# Patient Record
Sex: Male | Born: 1960
Health system: Southern US, Community
[De-identification: ages and names within clinical notes are randomized; demographics above are authoritative.]

## PROBLEM LIST (undated history)

## (undated) DIAGNOSIS — E039 Hypothyroidism, unspecified: Secondary | ICD-10-CM

## (undated) DIAGNOSIS — F32A Depression, unspecified: Secondary | ICD-10-CM

## (undated) DIAGNOSIS — Z9109 Other allergy status, other than to drugs and biological substances: Secondary | ICD-10-CM

## (undated) DIAGNOSIS — M1990 Unspecified osteoarthritis, unspecified site: Secondary | ICD-10-CM

## (undated) DIAGNOSIS — K219 Gastro-esophageal reflux disease without esophagitis: Secondary | ICD-10-CM

## (undated) DIAGNOSIS — F900 Attention-deficit hyperactivity disorder, predominantly inattentive type: Secondary | ICD-10-CM

## (undated) DIAGNOSIS — F329 Major depressive disorder, single episode, unspecified: Secondary | ICD-10-CM

## (undated) DIAGNOSIS — M51369 Other intervertebral disc degeneration, lumbar region without mention of lumbar back pain or lower extremity pain: Secondary | ICD-10-CM

## (undated) DIAGNOSIS — I10 Essential (primary) hypertension: Secondary | ICD-10-CM

## (undated) DIAGNOSIS — M5136 Other intervertebral disc degeneration, lumbar region: Secondary | ICD-10-CM

## (undated) DIAGNOSIS — Z9289 Personal history of other medical treatment: Secondary | ICD-10-CM

## (undated) DIAGNOSIS — F419 Anxiety disorder, unspecified: Secondary | ICD-10-CM

## (undated) HISTORY — DX: Attention-deficit hyperactivity disorder, predominantly inattentive type: F90.0

## (undated) HISTORY — DX: Anxiety disorder, unspecified: F41.9

---

## 1983-01-19 HISTORY — PX: BACK SURGERY: SHX140

## 1996-01-19 HISTORY — PX: THYROIDECTOMY, PARTIAL: SHX18

## 2005-07-23 ENCOUNTER — Encounter: Admission: RE | Admit: 2005-07-23 | Discharge: 2005-07-23 | Payer: Self-pay | Admitting: Family Medicine

## 2006-03-04 ENCOUNTER — Encounter: Admission: RE | Admit: 2006-03-04 | Discharge: 2006-03-04 | Payer: Self-pay | Admitting: Family Medicine

## 2009-02-17 ENCOUNTER — Inpatient Hospital Stay (HOSPITAL_COMMUNITY): Admission: RE | Admit: 2009-02-17 | Discharge: 2009-02-20 | Payer: Self-pay | Admitting: Psychiatry

## 2009-02-17 ENCOUNTER — Ambulatory Visit: Payer: Self-pay | Admitting: Psychiatry

## 2009-02-25 ENCOUNTER — Ambulatory Visit: Payer: Self-pay | Admitting: Psychiatry

## 2009-02-25 ENCOUNTER — Other Ambulatory Visit (HOSPITAL_COMMUNITY): Admission: RE | Admit: 2009-02-25 | Discharge: 2009-03-10 | Payer: Self-pay | Admitting: Psychiatry

## 2010-02-07 ENCOUNTER — Encounter: Payer: Self-pay | Admitting: Family Medicine

## 2010-04-06 LAB — DRUGS OF ABUSE SCREEN W/O ALC, ROUTINE URINE
Amphetamine Screen, Ur: NEGATIVE
Barbiturate Quant, Ur: NEGATIVE
Benzodiazepines.: NEGATIVE
Cocaine Metabolites: NEGATIVE
Creatinine,U: 186.2 mg/dL
Marijuana Metabolite: NEGATIVE
Methadone: NEGATIVE
Opiate Screen, Urine: NEGATIVE
Phencyclidine (PCP): NEGATIVE
Propoxyphene: NEGATIVE

## 2010-04-06 LAB — TSH: TSH: 3.244 u[IU]/mL (ref 0.350–4.500)

## 2010-04-06 LAB — CBC
HCT: 44.3 % (ref 39.0–52.0)
Hemoglobin: 15.1 g/dL (ref 13.0–17.0)
MCHC: 34.1 g/dL (ref 30.0–36.0)
MCV: 93.2 fL (ref 78.0–100.0)
Platelets: 158 10*3/uL (ref 150–400)
RBC: 4.75 MIL/uL (ref 4.22–5.81)
RDW: 12.9 % (ref 11.5–15.5)
WBC: 6.2 10*3/uL (ref 4.0–10.5)

## 2010-04-06 LAB — COMPREHENSIVE METABOLIC PANEL
ALT: 21 U/L (ref 0–53)
AST: 27 U/L (ref 0–37)
Albumin: 3.8 g/dL (ref 3.5–5.2)
Alkaline Phosphatase: 56 U/L (ref 39–117)
BUN: 19 mg/dL (ref 6–23)
CO2: 29 mEq/L (ref 19–32)
Calcium: 8.8 mg/dL (ref 8.4–10.5)
Chloride: 101 mEq/L (ref 96–112)
Creatinine, Ser: 1.39 mg/dL (ref 0.4–1.5)
GFR calc Af Amer: >60 mL/min (ref >60)
GFR calc non Af Amer: 55 mL/min — ABNORMAL LOW (ref >60)
Glucose, Bld: 100 mg/dL — ABNORMAL HIGH (ref 70–99)
Potassium: 3.5 mEq/L (ref 3.5–5.1)
Sodium: 137 mEq/L (ref 135–145)
Total Bilirubin: 0.9 mg/dL (ref 0.3–1.2)
Total Protein: 6.9 g/dL (ref 6.0–8.3)

## 2010-04-06 LAB — DIFFERENTIAL
Basophils Absolute: 0.1 10*3/uL (ref 0.0–0.1)
Basophils Relative: 1 % (ref 0–1)
Eosinophils Absolute: 0.1 10*3/uL (ref 0.0–0.7)
Eosinophils Relative: 2 % (ref 0–5)
Lymphocytes Relative: 25 % (ref 12–46)
Lymphs Abs: 1.6 10*3/uL (ref 0.7–4.0)
Monocytes Absolute: 1 10*3/uL (ref 0.1–1.0)
Monocytes Relative: 16 % — ABNORMAL HIGH (ref 3–12)
Neutro Abs: 3.5 10*3/uL (ref 1.7–7.7)
Neutrophils Relative %: 56 % (ref 43–77)

## 2012-04-19 ENCOUNTER — Encounter (HOSPITAL_COMMUNITY): Payer: Self-pay | Admitting: Pharmacy Technician

## 2012-04-25 NOTE — Patient Instructions (Addendum)
20 [name] NOBLE CICALESE  04/25/2012   Your procedure is scheduled on:  05/02/12 TUESDAY  Report to [facility] at     1:35 PM Call this number if you have problems the morning of surgery: [phone]       Remember:   Do not eat food After Midnight.MONDAY NIGHT--- MAY HAVE CLEAR LIQUIDS UNTIL 10:30 AM Tuesday MORNING--NO MILK PRODUCTS-  NOTHING BY MOUTH AFTER 1030 AM   Take these medicines the morning of surgery with A SIP OF WATER: Levothyroxine, PROPRANOL, Bupropian, Claritin, Prolisec, VIBRID,  May take Lortab if needed  .  Contacts, dentures or partial plates can not be worn to surgery  Leave suitcase in the car. After surgery it may be brought to your room.  For patients admitted to the hospital, checkout time is 11:00 AM day of  discharge.             SPECIAL INSTRUCTIONS- SEE  PREPARING FOR SURGERY INSTRUCTION SHEET-     DO NOT WEAR JEWELRY, LOTIONS, POWDERS, OR PERFUMES.  WOMEN-- DO NOT SHAVE LEGS OR UNDERARMS FOR 12 HOURS BEFORE SHOWERS. MEN MAY SHAVE FACE.  Patients discharged the day of surgery will not be allowed to drive home. IF going home the day of surgery, you must have a driver and someone to stay with you for the first 24 hours  Name and phone number of your driver:  admission                                                                      Please read over the following fact sheets that you were given: MRSA Information, Incentive Spirometry Sheet, Blood Transfusion Sheet  Information                                                                                   Patrick [name]  PST [phone]  [phone]                 FAILURE TO FOLLOW THESE INSTRUCTIONS MAY RESULT IN  CANCELLATION   OF YOUR SURGERY                                                  Patient Signature _____________________________

## 2012-04-25 NOTE — Progress Notes (Signed)
Clearance note with OV 03/16/12 Dr Jillyn Hidden on chart

## 2012-04-26 ENCOUNTER — Encounter (HOSPITAL_COMMUNITY)
Admission: RE | Admit: 2012-04-26 | Discharge: 2012-04-26 | Disposition: A | Discharge status: Home / Self Care | Payer: Managed Care, Other (non HMO) | Source: Ambulatory Visit | Attending: Orthopedic Surgery | Admitting: Orthopedic Surgery

## 2012-04-26 ENCOUNTER — Other Ambulatory Visit (HOSPITAL_COMMUNITY): Payer: Self-pay

## 2012-04-26 ENCOUNTER — Ambulatory Visit (HOSPITAL_COMMUNITY)
Admission: RE | Admit: 2012-04-26 | Discharge: 2012-04-26 | Disposition: A | Discharge status: Home / Self Care | Payer: Managed Care, Other (non HMO) | Source: Ambulatory Visit | Attending: Orthopedic Surgery | Admitting: Orthopedic Surgery

## 2012-04-26 ENCOUNTER — Encounter (HOSPITAL_COMMUNITY): Payer: Self-pay

## 2012-04-26 DIAGNOSIS — Z01812 Encounter for preprocedural laboratory examination: Secondary | ICD-10-CM | POA: Insufficient documentation

## 2012-04-26 DIAGNOSIS — Z79899 Other long term (current) drug therapy: Secondary | ICD-10-CM | POA: Insufficient documentation

## 2012-04-26 DIAGNOSIS — Z87891 Personal history of nicotine dependence: Secondary | ICD-10-CM | POA: Insufficient documentation

## 2012-04-26 DIAGNOSIS — Z01818 Encounter for other preprocedural examination: Secondary | ICD-10-CM | POA: Insufficient documentation

## 2012-04-26 DIAGNOSIS — M169 Osteoarthritis of hip, unspecified: Secondary | ICD-10-CM | POA: Insufficient documentation

## 2012-04-26 DIAGNOSIS — M1610 Unilateral primary osteoarthritis, unspecified hip: Secondary | ICD-10-CM | POA: Insufficient documentation

## 2012-04-26 DIAGNOSIS — K219 Gastro-esophageal reflux disease without esophagitis: Secondary | ICD-10-CM | POA: Insufficient documentation

## 2012-04-26 DIAGNOSIS — Z01810 Encounter for preprocedural cardiovascular examination: Secondary | ICD-10-CM | POA: Insufficient documentation

## 2012-04-26 DIAGNOSIS — E039 Hypothyroidism, unspecified: Secondary | ICD-10-CM | POA: Insufficient documentation

## 2012-04-26 DIAGNOSIS — I10 Essential (primary) hypertension: Secondary | ICD-10-CM | POA: Insufficient documentation

## 2012-04-26 HISTORY — DX: Depression, unspecified: F32.A

## 2012-04-26 HISTORY — DX: Unspecified osteoarthritis, unspecified site: M19.90

## 2012-04-26 HISTORY — DX: Other intervertebral disc degeneration, lumbar region without mention of lumbar back pain or lower extremity pain: M51.369

## 2012-04-26 HISTORY — DX: Gastro-esophageal reflux disease without esophagitis: K21.9

## 2012-04-26 HISTORY — DX: Other allergy status, other than to drugs and biological substances: Z91.09

## 2012-04-26 HISTORY — DX: Personal history of other medical treatment: Z92.89

## 2012-04-26 HISTORY — DX: Essential (primary) hypertension: I10

## 2012-04-26 HISTORY — DX: Hypothyroidism, unspecified: E03.9

## 2012-04-26 HISTORY — DX: Major depressive disorder, single episode, unspecified: F32.9

## 2012-04-26 HISTORY — DX: Other intervertebral disc degeneration, lumbar region: M51.36

## 2012-04-26 LAB — URINALYSIS, ROUTINE W REFLEX MICROSCOPIC
Bilirubin Urine: NEGATIVE
Glucose, UA: NEGATIVE mg/dL
Hgb urine dipstick: NEGATIVE
Ketones, ur: NEGATIVE mg/dL
Leukocytes, UA: NEGATIVE
Nitrite: NEGATIVE
Protein, ur: NEGATIVE mg/dL
Specific Gravity, Urine: 1.02 (ref 1.005–1.030)
Urobilinogen, UA: 0.2 mg/dL (ref 0.0–1.0)
pH: 6 (ref 5.0–8.0)

## 2012-04-26 LAB — BASIC METABOLIC PANEL
BUN: 19 mg/dL (ref 6–23)
CO2: 32 mEq/L (ref 19–32)
Calcium: 9.4 mg/dL (ref 8.4–10.5)
Chloride: 102 mEq/L (ref 96–112)
Creatinine, Ser: 1.33 mg/dL (ref 0.50–1.35)
GFR calc Af Amer: 71 mL/min — ABNORMAL LOW (ref >90)
GFR calc non Af Amer: 61 mL/min — ABNORMAL LOW (ref >90)
Glucose, Bld: 108 mg/dL — ABNORMAL HIGH (ref 70–99)
Potassium: 4.5 mEq/L (ref 3.5–5.1)
Sodium: 141 mEq/L (ref 135–145)

## 2012-04-26 LAB — CBC
HCT: 48.2 % (ref 39.0–52.0)
Hemoglobin: 16.5 g/dL (ref 13.0–17.0)
MCH: 31.1 pg (ref 26.0–34.0)
MCHC: 34.2 g/dL (ref 30.0–36.0)
MCV: 90.8 fL (ref 78.0–100.0)
Platelets: 186 10*3/uL (ref 150–400)
RBC: 5.31 MIL/uL (ref 4.22–5.81)
RDW: 12.8 % (ref 11.5–15.5)
WBC: 5.4 10*3/uL (ref 4.0–10.5)

## 2012-04-26 LAB — ABO/RH: ABO/RH(D): A NEG

## 2012-04-26 LAB — SURGICAL PCR SCREEN
MRSA, PCR: NEGATIVE
Staphylococcus aureus: POSITIVE — AB

## 2012-04-26 LAB — PROTIME-INR
INR: 0.95 (ref 0.00–1.49)
Prothrombin Time: 12.6 seconds (ref 11.6–15.2)

## 2012-04-26 LAB — APTT: aPTT: 29 seconds (ref 24–37)

## 2012-04-30 NOTE — H&P (Signed)
TOTAL HIP ADMISSION H&P  Patient is admitted for left total hip arthroplasty, anterior approach.  Subjective:  Chief Complaint: left hip OA / pain  HPI: Patrick [name], 52 y.o. male, has a history of pain and functional disability in the left hip(s) due to arthritis and patient has failed non-surgical conservative treatments for greater than 12 weeks to include NSAID's and/or analgesics and activity modification.  Onset of symptoms was gradual starting 2 years ago with gradually worsening course since that time.The patient noted no past surgery on the left hip(s).  Patient currently rates pain in the left hip at 8 out of 10 with activity. Patient has worsening of pain with activity and weight bearing, trendelenberg gait, pain that interfers with activities of daily living and pain with passive range of motion. Patient has evidence of periarticular osteophytes and joint space narrowing by imaging studies. This condition presents safety issues increasing the risk of falls. There is no current active infection.  Risks, benefits and expectations were discussed with the patient. Patient understand the risks, benefits and expectations and wishes to proceed with surgery.   D/C Plans:   Home with HHPT  Post-op Meds:   Rx given for Xarelto, Zanaflex, Iron, MiraLax and Colace  Tranexamic Acid:   To be given  Decadron:    To be given  FYI:   Nothing to note   Past Medical History  Diagnosis Date  . Hypertension   . Hypothyroidism   . Depression   . GERD (gastroesophageal reflux disease)   . Arthritis   . History of blood transfusion   . DDD (degenerative disc disease), lumbar   . Environmental allergies     Past Surgical History  Procedure Laterality Date  . Thyroidectomy, partial  1998  . Back surgery Right 1985    microdiscectomy    No prescriptions prior to admission   Allergies  Allergen Reactions  . Aspirin Hives    History  Substance Use Topics  . Smoking status: Former  Smoker    Types: Cigarettes    Quit date: 04/27/2002  . Smokeless tobacco: Former Neurosurgeon    Quit date: 04/27/2002  . Alcohol Use: Yes     Comment: 3-4 glasses wine/beer  week    No family history on file.   Review of Systems  Constitutional: Negative.   HENT: Negative.   Eyes: Negative.   Respiratory: Negative.   Cardiovascular: Negative.   Gastrointestinal: Negative.   Genitourinary: Negative.   Musculoskeletal: Positive for joint pain.  Skin: Negative.   Neurological: Negative.   Endo/Heme/Allergies: Negative.   Psychiatric/Behavioral: Positive for depression. The patient is nervous/anxious.     Objective:  Physical Exam  Constitutional: He is oriented to person, place, and time. He appears well-developed and well-nourished.  HENT:  Head: Normocephalic and atraumatic.  Mouth/Throat: Oropharynx is clear and moist.  Eyes: Pupils are equal, round, and reactive to light.  Neck: Neck supple. No JVD present. No tracheal deviation present. No thyromegaly present.  Cardiovascular: Normal rate, regular rhythm, normal heart sounds and intact distal pulses.   Respiratory: Effort normal and breath sounds normal. No stridor.  GI: Soft. There is no tenderness. There is no guarding.  Musculoskeletal:       Left hip: He exhibits decreased range of motion, decreased strength, tenderness and bony tenderness. He exhibits no swelling, no deformity and no laceration.  Lymphadenopathy:    He has no cervical adenopathy.  Neurological: He is alert and oriented to person, place, and time.  Skin: Skin is warm and dry.  Psychiatric: He has a normal mood and affect.     Imaging Review Plain radiographs demonstrate severe degenerative joint disease of the left hip(s). The bone quality appears to be good for age and reported activity level.  Assessment/Plan:  End stage arthritis, left hip(s)  The patient history, physical examination, clinical judgement of the provider and imaging studies are  consistent with end stage degenerative joint disease of the left hip(s) and total hip arthroplasty is deemed medically necessary. The treatment options including medical management, injection therapy, arthroscopy and arthroplasty were discussed at length. The risks and benefits of total hip arthroplasty were presented and reviewed. The risks due to aseptic loosening, infection, stiffness, dislocation/subluxation,  thromboembolic complications and other imponderables were discussed.  The patient acknowledged the explanation, agreed to proceed with the plan and consent was signed. Patient is being admitted for inpatient treatment for surgery, pain control, PT, OT, prophylactic antibiotics, VTE prophylaxis, progressive ambulation and ADL's and discharge planning.The patient is planning to be discharged home with home health services.    Anastasio Auerbach Wendi Lastra   PAC  04/30/2012, 9:25 PM

## 2012-05-02 ENCOUNTER — Inpatient Hospital Stay (HOSPITAL_COMMUNITY): Payer: Managed Care, Other (non HMO)

## 2012-05-02 ENCOUNTER — Encounter (HOSPITAL_COMMUNITY)
Admission: RE | Disposition: A | Discharge status: Home Health | Payer: Self-pay | Source: Ambulatory Visit | Attending: Orthopedic Surgery

## 2012-05-02 ENCOUNTER — Encounter (HOSPITAL_COMMUNITY): Payer: Self-pay | Admitting: *Deleted

## 2012-05-02 ENCOUNTER — Inpatient Hospital Stay (HOSPITAL_COMMUNITY): Payer: Managed Care, Other (non HMO) | Admitting: Anesthesiology

## 2012-05-02 ENCOUNTER — Inpatient Hospital Stay (HOSPITAL_COMMUNITY)
Admission: RE | Admit: 2012-05-02 | Discharge: 2012-05-03 | DRG: 470 | Disposition: A | Discharge status: Home Health | Payer: Managed Care, Other (non HMO) | Source: Ambulatory Visit | Attending: Orthopedic Surgery | Admitting: Orthopedic Surgery

## 2012-05-02 ENCOUNTER — Encounter (HOSPITAL_COMMUNITY): Payer: Self-pay | Admitting: Anesthesiology

## 2012-05-02 DIAGNOSIS — F329 Major depressive disorder, single episode, unspecified: Secondary | ICD-10-CM | POA: Diagnosis present

## 2012-05-02 DIAGNOSIS — I10 Essential (primary) hypertension: Secondary | ICD-10-CM | POA: Diagnosis present

## 2012-05-02 DIAGNOSIS — F3289 Other specified depressive episodes: Secondary | ICD-10-CM | POA: Diagnosis present

## 2012-05-02 DIAGNOSIS — M169 Osteoarthritis of hip, unspecified: Principal | ICD-10-CM | POA: Diagnosis present

## 2012-05-02 DIAGNOSIS — E039 Hypothyroidism, unspecified: Secondary | ICD-10-CM | POA: Diagnosis present

## 2012-05-02 DIAGNOSIS — Z96649 Presence of unspecified artificial hip joint: Secondary | ICD-10-CM

## 2012-05-02 DIAGNOSIS — E663 Overweight: Secondary | ICD-10-CM

## 2012-05-02 DIAGNOSIS — Z87891 Personal history of nicotine dependence: Secondary | ICD-10-CM

## 2012-05-02 DIAGNOSIS — K219 Gastro-esophageal reflux disease without esophagitis: Secondary | ICD-10-CM | POA: Diagnosis present

## 2012-05-02 DIAGNOSIS — Z6828 Body mass index (BMI) 28.0-28.9, adult: Secondary | ICD-10-CM

## 2012-05-02 DIAGNOSIS — M1610 Unilateral primary osteoarthritis, unspecified hip: Principal | ICD-10-CM | POA: Diagnosis present

## 2012-05-02 HISTORY — PX: TOTAL HIP ARTHROPLASTY: SHX124

## 2012-05-02 LAB — TYPE AND SCREEN
ABO/RH(D): A NEG
Antibody Screen: NEGATIVE

## 2012-05-02 SURGERY — ARTHROPLASTY, HIP, TOTAL, ANTERIOR APPROACH
Anesthesia: Spinal | Site: Hip | Laterality: Left | Wound class: Clean

## 2012-05-02 MED ORDER — METHOCARBAMOL 100 MG/ML IJ SOLN: 500.0000 mg | Freq: Four times a day (QID) | INTRAMUSCULAR | Status: DC | PRN

## 2012-05-02 MED ORDER — 0.9 % SODIUM CHLORIDE (POUR BTL) OPTIME
TOPICAL | Status: DC | PRN
  Administered 2012-05-02: 1000 mL

## 2012-05-02 MED ORDER — LIDOCAINE HCL (CARDIAC) 20 MG/ML IV SOLN
INTRAVENOUS | Status: DC | PRN
  Administered 2012-05-02: 30 mg via INTRAVENOUS

## 2012-05-02 MED ORDER — ALUM & MAG HYDROXIDE-SIMETH 200-200-20 MG/5ML PO SUSP: 30.0000 mL | ORAL | Status: DC | PRN

## 2012-05-02 MED ORDER — AMPHETAMINE-DEXTROAMPHETAMINE 10 MG PO TABS
30.0000 mg | ORAL_TABLET | Freq: Two times a day (BID) | ORAL | Status: DC
  Administered 2012-05-03: 30 mg via ORAL
  Filled 2012-05-02: qty 1
  Filled 2012-05-02: qty 3

## 2012-05-02 MED ORDER — LEVOTHYROXINE SODIUM 50 MCG PO TABS
50.0000 ug | ORAL_TABLET | Freq: Every day | ORAL | Status: DC
  Administered 2012-05-03: 50 ug via ORAL
  Filled 2012-05-02 (×2): qty 1

## 2012-05-02 MED ORDER — FENTANYL CITRATE 0.05 MG/ML IJ SOLN: 25.0000 ug | INTRAMUSCULAR | Status: DC | PRN

## 2012-05-02 MED ORDER — CEFAZOLIN SODIUM-DEXTROSE 2-3 GM-% IV SOLR
2.0000 g | INTRAVENOUS | Status: AC
  Administered 2012-05-02: 2 g via INTRAVENOUS
  Administered 2012-05-02: 1 g via INTRAVENOUS

## 2012-05-02 MED ORDER — DEXAMETHASONE SODIUM PHOSPHATE 10 MG/ML IJ SOLN
10.0000 mg | Freq: Once | INTRAMUSCULAR | Status: DC
  Filled 2012-05-02: qty 1

## 2012-05-02 MED ORDER — PROPRANOLOL HCL ER BEADS 80 MG PO CP24: 80.0000 mg | ORAL_CAPSULE | Freq: Every day | ORAL | Status: DC

## 2012-05-02 MED ORDER — CEFAZOLIN SODIUM-DEXTROSE 2-3 GM-% IV SOLR
2.0000 g | Freq: Four times a day (QID) | INTRAVENOUS | Status: AC
  Administered 2012-05-02 – 2012-05-03 (×2): 2 g via INTRAVENOUS
  Filled 2012-05-02 (×2): qty 50

## 2012-05-02 MED ORDER — CEFAZOLIN SODIUM-DEXTROSE 2-3 GM-% IV SOLR: 2.0000 g | Freq: Four times a day (QID) | INTRAVENOUS | Status: DC

## 2012-05-02 MED ORDER — PHENOL 1.4 % MT LIQD: 1.0000 | OROMUCOSAL | Status: DC | PRN

## 2012-05-02 MED ORDER — OXYCODONE HCL 5 MG PO TABS
5.0000 mg | ORAL_TABLET | ORAL | Status: DC
  Administered 2012-05-02: 10 mg via ORAL
  Administered 2012-05-03 (×2): 15 mg via ORAL
  Filled 2012-05-02 (×2): qty 3
  Filled 2012-05-02: qty 2

## 2012-05-02 MED ORDER — ONDANSETRON HCL 4 MG/2ML IJ SOLN
INTRAMUSCULAR | Status: DC | PRN
  Administered 2012-05-02: 4 mg via INTRAVENOUS

## 2012-05-02 MED ORDER — LORATADINE 10 MG PO TABS
10.0000 mg | ORAL_TABLET | Freq: Every day | ORAL | Status: DC
  Administered 2012-05-03: 10 mg via ORAL
  Filled 2012-05-02: qty 1

## 2012-05-02 MED ORDER — MENTHOL 3 MG MT LOZG: 1.0000 | LOZENGE | OROMUCOSAL | Status: DC | PRN

## 2012-05-02 MED ORDER — FENTANYL CITRATE 0.05 MG/ML IJ SOLN
INTRAMUSCULAR | Status: DC | PRN
  Administered 2012-05-02 (×2): 50 ug via INTRAVENOUS

## 2012-05-02 MED ORDER — SODIUM CHLORIDE 0.9 % IV SOLN
100.0000 mL/h | INTRAVENOUS | Status: DC
  Administered 2012-05-02: 100 mL/h via INTRAVENOUS
  Filled 2012-05-02 (×3): qty 1000

## 2012-05-02 MED ORDER — FLEET ENEMA 7-19 GM/118ML RE ENEM: 1.0000 | ENEMA | Freq: Once | RECTAL | Status: AC | PRN

## 2012-05-02 MED ORDER — PROPRANOLOL HCL ER 80 MG PO CP24
80.0000 mg | ORAL_CAPSULE | Freq: Every day | ORAL | Status: DC
  Administered 2012-05-03: 80 mg via ORAL
  Filled 2012-05-02: qty 1

## 2012-05-02 MED ORDER — METOCLOPRAMIDE HCL 5 MG/ML IJ SOLN: 5.0000 mg | Freq: Three times a day (TID) | INTRAMUSCULAR | Status: DC | PRN

## 2012-05-02 MED ORDER — MIDAZOLAM HCL 5 MG/5ML IJ SOLN
INTRAMUSCULAR | Status: DC | PRN
  Administered 2012-05-02: 2 mg via INTRAVENOUS

## 2012-05-02 MED ORDER — DEXAMETHASONE SODIUM PHOSPHATE 10 MG/ML IJ SOLN
10.0000 mg | Freq: Once | INTRAMUSCULAR | Status: AC
  Administered 2012-05-02: 10 mg via INTRAVENOUS

## 2012-05-02 MED ORDER — ACETAMINOPHEN 10 MG/ML IV SOLN
1000.0000 mg | Freq: Four times a day (QID) | INTRAVENOUS | Status: DC
  Administered 2012-05-02 (×2): 1000 mg via INTRAVENOUS

## 2012-05-02 MED ORDER — HYDROMORPHONE HCL PF 1 MG/ML IJ SOLN: 0.5000 mg | INTRAMUSCULAR | Status: DC | PRN

## 2012-05-02 MED ORDER — BISACODYL 10 MG RE SUPP: 10.0000 mg | Freq: Every day | RECTAL | Status: DC | PRN

## 2012-05-02 MED ORDER — PROPOFOL INFUSION 10 MG/ML OPTIME
INTRAVENOUS | Status: DC | PRN
  Administered 2012-05-02: 75 ug/kg/min via INTRAVENOUS

## 2012-05-02 MED ORDER — FERROUS SULFATE 325 (65 FE) MG PO TABS
325.0000 mg | ORAL_TABLET | Freq: Three times a day (TID) | ORAL | Status: DC
  Administered 2012-05-03 (×2): 325 mg via ORAL
  Filled 2012-05-02 (×4): qty 1

## 2012-05-02 MED ORDER — VILAZODONE HCL 20 MG PO TABS
40.0000 mg | ORAL_TABLET | Freq: Every day | ORAL | Status: DC
Start: 2012-05-03 — End: 2012-05-03
  Administered 2012-05-03: 40 mg via ORAL
  Filled 2012-05-02 (×2): qty 2

## 2012-05-02 MED ORDER — ONDANSETRON HCL 4 MG/2ML IJ SOLN: 4.0000 mg | Freq: Four times a day (QID) | INTRAMUSCULAR | Status: DC | PRN

## 2012-05-02 MED ORDER — HYDROMORPHONE HCL PF 1 MG/ML IJ SOLN
INTRAMUSCULAR | Status: DC | PRN
  Administered 2012-05-02 (×2): 1 mg via INTRAVENOUS

## 2012-05-02 MED ORDER — BUPIVACAINE HCL (PF) 0.5 % IJ SOLN
INTRAMUSCULAR | Status: DC | PRN
  Administered 2012-05-02: 15 mg

## 2012-05-02 MED ORDER — CELECOXIB 200 MG PO CAPS
200.0000 mg | ORAL_CAPSULE | Freq: Two times a day (BID) | ORAL | Status: DC
  Administered 2012-05-02 – 2012-05-03 (×2): 200 mg via ORAL
  Filled 2012-05-02 (×3): qty 1

## 2012-05-02 MED ORDER — METHOCARBAMOL 500 MG PO TABS
500.0000 mg | ORAL_TABLET | Freq: Four times a day (QID) | ORAL | Status: DC | PRN
  Administered 2012-05-02: 500 mg via ORAL
  Filled 2012-05-02: qty 1

## 2012-05-02 MED ORDER — TRANEXAMIC ACID 100 MG/ML IV SOLN
1000.0000 mg | Freq: Once | INTRAVENOUS | Status: AC
  Administered 2012-05-02: 1000 mg via INTRAVENOUS
  Filled 2012-05-02: qty 10

## 2012-05-02 MED ORDER — LACTATED RINGERS IV SOLN
INTRAVENOUS | Status: DC
  Administered 2012-05-02: 18:00:00 via INTRAVENOUS
  Administered 2012-05-02: 1000 mL via INTRAVENOUS

## 2012-05-02 MED ORDER — ZOLPIDEM TARTRATE 5 MG PO TABS: 5.0000 mg | ORAL_TABLET | Freq: Every evening | ORAL | Status: DC | PRN

## 2012-05-02 MED ORDER — ONDANSETRON HCL 4 MG PO TABS: 4.0000 mg | ORAL_TABLET | Freq: Four times a day (QID) | ORAL | Status: DC | PRN

## 2012-05-02 MED ORDER — AMLODIPINE BESYLATE 5 MG PO TABS
5.0000 mg | ORAL_TABLET | Freq: Every evening | ORAL | Status: DC
  Filled 2012-05-02 (×2): qty 1

## 2012-05-02 MED ORDER — BUPROPION HCL ER (XL) 300 MG PO TB24
300.0000 mg | ORAL_TABLET | Freq: Every day | ORAL | Status: DC
  Administered 2012-05-03: 300 mg via ORAL
  Filled 2012-05-02 (×2): qty 1

## 2012-05-02 MED ORDER — PANTOPRAZOLE SODIUM 40 MG PO TBEC
40.0000 mg | DELAYED_RELEASE_TABLET | Freq: Every day | ORAL | Status: DC
  Administered 2012-05-03: 40 mg via ORAL
  Filled 2012-05-02: qty 1

## 2012-05-02 MED ORDER — METOCLOPRAMIDE HCL 10 MG PO TABS: 5.0000 mg | ORAL_TABLET | Freq: Three times a day (TID) | ORAL | Status: DC | PRN

## 2012-05-02 MED ORDER — VILAZODONE HCL 40 MG PO TABS: 40.0000 mg | ORAL_TABLET | Freq: Every day | ORAL | Status: DC

## 2012-05-02 MED ORDER — DOCUSATE SODIUM 100 MG PO CAPS
100.0000 mg | ORAL_CAPSULE | Freq: Two times a day (BID) | ORAL | Status: DC
  Administered 2012-05-02: 100 mg via ORAL

## 2012-05-02 MED ORDER — DIPHENHYDRAMINE HCL 25 MG PO CAPS: 25.0000 mg | ORAL_CAPSULE | Freq: Four times a day (QID) | ORAL | Status: DC | PRN

## 2012-05-02 MED ORDER — POLYETHYLENE GLYCOL 3350 17 G PO PACK: 17.0000 g | PACK | Freq: Two times a day (BID) | ORAL | Status: DC

## 2012-05-02 MED ORDER — PROMETHAZINE HCL 25 MG/ML IJ SOLN: 6.2500 mg | INTRAMUSCULAR | Status: DC | PRN

## 2012-05-02 MED ORDER — RIVAROXABAN 10 MG PO TABS
10.0000 mg | ORAL_TABLET | ORAL | Status: DC
  Administered 2012-05-03: 10 mg via ORAL
  Filled 2012-05-02 (×2): qty 1

## 2012-05-02 SURGICAL SUPPLY — 39 items
ADH SKN CLS APL DERMABOND .7 (GAUZE/BANDAGES/DRESSINGS) ×1
BAG SPEC THK2 15X12 ZIP CLS (MISCELLANEOUS) ×2
BAG ZIPLOCK 12X15 (MISCELLANEOUS) ×4 IMPLANT
BLADE SAW SGTL 18X1.27X75 (BLADE) ×2 IMPLANT
CLOTH BEACON ORANGE TIMEOUT ST (SAFETY) ×2 IMPLANT
DERMABOND ADVANCED (GAUZE/BANDAGES/DRESSINGS) ×1
DERMABOND ADVANCED .7 DNX12 (GAUZE/BANDAGES/DRESSINGS) ×1 IMPLANT
DRAPE C-ARM 42X72 X-RAY (DRAPES) ×2 IMPLANT
DRAPE STERI IOBAN 125X83 (DRAPES) ×2 IMPLANT
DRAPE U-SHAPE 47X51 STRL (DRAPES) ×6 IMPLANT
DRSG AQUACEL AG ADV 3.5X10 (GAUZE/BANDAGES/DRESSINGS) ×2 IMPLANT
DRSG TEGADERM 4X4.75 (GAUZE/BANDAGES/DRESSINGS) ×1 IMPLANT
DURAPREP 26ML APPLICATOR (WOUND CARE) ×2 IMPLANT
ELECT BLADE TIP CTD 4 INCH (ELECTRODE) ×2 IMPLANT
ELECT REM PT RETURN 9FT ADLT (ELECTROSURGICAL) ×2
ELECTRODE REM PT RTRN 9FT ADLT (ELECTROSURGICAL) ×1 IMPLANT
EVACUATOR 1/8 PVC DRAIN (DRAIN) ×1 IMPLANT
FACESHIELD LNG OPTICON STERILE (SAFETY) ×8 IMPLANT
GAUZE SPONGE 2X2 8PLY STRL LF (GAUZE/BANDAGES/DRESSINGS) ×1 IMPLANT
GLOVE BIOGEL PI IND STRL 7.5 (GLOVE) ×1 IMPLANT
GLOVE BIOGEL PI IND STRL 8 (GLOVE) ×1 IMPLANT
GLOVE BIOGEL PI INDICATOR 7.5 (GLOVE) ×1
GLOVE BIOGEL PI INDICATOR 8 (GLOVE) ×1
GLOVE ECLIPSE 8.0 STRL XLNG CF (GLOVE) ×2 IMPLANT
GLOVE ORTHO TXT STRL SZ7.5 (GLOVE) ×4 IMPLANT
GOWN BRE IMP PREV XXLGXLNG (GOWN DISPOSABLE) ×2 IMPLANT
GOWN STRL NON-REIN LRG LVL3 (GOWN DISPOSABLE) ×2 IMPLANT
KIT BASIN OR (CUSTOM PROCEDURE TRAY) ×2 IMPLANT
PACK TOTAL JOINT (CUSTOM PROCEDURE TRAY) ×2 IMPLANT
PADDING CAST COTTON 6X4 STRL (CAST SUPPLIES) ×2 IMPLANT
SPONGE GAUZE 2X2 STER 10/PKG (GAUZE/BANDAGES/DRESSINGS) ×1
SUCTION FRAZIER 12FR DISP (SUCTIONS) ×2 IMPLANT
SUT MNCRL AB 4-0 PS2 18 (SUTURE) ×2 IMPLANT
SUT VIC AB 1 CT1 36 (SUTURE) ×8 IMPLANT
SUT VIC AB 2-0 CT1 27 (SUTURE) ×4
SUT VIC AB 2-0 CT1 TAPERPNT 27 (SUTURE) ×2 IMPLANT
SUT VLOC 180 0 24IN GS25 (SUTURE) ×2 IMPLANT
TOWEL OR 17X26 10 PK STRL BLUE (TOWEL DISPOSABLE) ×4 IMPLANT
TRAY FOLEY CATH 14FRSI W/METER (CATHETERS) ×2 IMPLANT

## 2012-05-02 NOTE — Progress Notes (Signed)
Received orders for rw and commode.  Will deliver to patient's room prior to d/c. °

## 2012-05-02 NOTE — Anesthesia Preprocedure Evaluation (Signed)
Anesthesia Evaluation  Patient identified by MRN, date of birth, ID band Patient awake    Reviewed: Allergy & Precautions, H&P , NPO status , Patient's Chart, lab work & pertinent test results  Airway Mallampati: II TM Distance: >3 FB Neck ROM: Full    Dental no notable dental hx.    Pulmonary neg pulmonary ROS,  breath sounds clear to auscultation  Pulmonary exam normal       Cardiovascular hypertension, Pt. on medications Rhythm:Regular Rate:Normal     Neuro/Psych negative neurological ROS  negative psych ROS   GI/Hepatic Neg liver ROS, GERD-  Medicated,  Endo/Other  Hypothyroidism   Renal/GU negative Renal ROS  negative genitourinary   Musculoskeletal negative musculoskeletal ROS (+)   Abdominal   Peds negative pediatric ROS (+)  Hematology negative hematology ROS (+)   Anesthesia Other Findings   Reproductive/Obstetrics negative OB ROS                           Anesthesia Physical Anesthesia Plan  ASA: II  Anesthesia Plan: Spinal   Post-op Pain Management:    Induction: Intravenous  Airway Management Planned: Simple Face Mask  Additional Equipment:   Intra-op Plan:   Post-operative Plan:   Informed Consent: I have reviewed the patients History and Physical, chart, labs and discussed the procedure including the risks, benefits and alternatives for the proposed anesthesia with the patient or authorized representative who has indicated his/her understanding and acceptance.     Plan Discussed with: CRNA and Surgeon  Anesthesia Plan Comments:         Anesthesia Quick Evaluation

## 2012-05-02 NOTE — Op Note (Signed)
NAME:  Patrick [name].: [id number]      MEDICAL RECORD NO.: [id number]      FACILITY:  [facility]      PHYSICIAN:  Durene Romans D  DATE OF BIRTH:  Oct 30, 1960     DATE OF PROCEDURE:  05/02/2012                                 OPERATIVE REPORT         PREOPERATIVE DIAGNOSIS: Left  hip osteoarthritis.      POSTOPERATIVE DIAGNOSIS:  Left hip osteoarthritis.      PROCEDURE:  Left total hip replacement through an anterior approach   utilizing DePuy THR system, component size 54mm pinnacle cup, a size 36+4 neutral   Altrex liner, a size 7Hi Tri Lock stem with a 36+1.5 delta ceramic   ball.      SURGEON:  Madlyn Frankel. Charlann Boxer, M.D.      ASSISTANT:  Lanney Gins, PA-C     ANESTHESIA:  Spinal.      SPECIMENS:  None.      COMPLICATIONS:  None.      BLOOD LOSS:  500 cc     DRAINS:  One Hemovac.      INDICATION OF THE PROCEDURE:  [name] MANOLITO JUREWICZ is a 52 y.o. male who had   presented to office for evaluation of left hip pain.  Radiographs revealed   progressive degenerative changes with bone-on-bone   articulation to the  hip joint.  The patient had painful limited range of   motion significantly affecting their overall quality of life.  The patient was failing to    respond to conservative measures, and at this point was ready   to proceed with more definitive measures.  The patient has noted progressive   degenerative changes in his hip, progressive problems and dysfunction   with regarding the hip prior to surgery.  Consent was obtained for   benefit of pain relief.  Specific risk of infection, DVT, component   failure, dislocation, need for revision surgery, as well discussion of   the anterior versus posterior approach were reviewed.  Consent was   obtained for benefit of anterior pain relief through an anterior   approach.      PROCEDURE IN DETAIL:  The patient was brought to operative theater.   Once adequate anesthesia,  preoperative antibiotics, 2gm Ancef administered.   The patient was positioned supine on the OSI Hanna table.  Once adequate   padding of boney process was carried out, we had predraped out the hip, and  used fluoroscopy to confirm orientation of the pelvis and position.      The left hip was then prepped and draped from proximal iliac crest to   mid thigh with shower curtain technique.      Time-out was performed identifying the patient, planned procedure, and   extremity.     An incision was then made 2 cm distal and lateral to the   anterior superior iliac spine extending over the orientation of the   tensor fascia lata muscle and sharp dissection was carried down to the   fascia of the muscle and protractor placed in the soft tissues.      The fascia was then incised.  The muscle belly was identified and swept  laterally and retractor placed along the superior neck.  Following   cauterization of the circumflex vessels and removing some pericapsular   fat, a second cobra retractor was placed on the inferior neck.  A third   retractor was placed on the anterior acetabulum after elevating the   anterior rectus.  A L-capsulotomy was along the line of the   superior neck to the trochanteric fossa, then extended proximally and   distally.  Tag sutures were placed and the retractors were then placed   intracapsular.  We then identified the trochanteric fossa and   orientation of my neck cut, confirmed this radiographically   and then made a neck osteotomy with the femur on traction.  The femoral   head was removed without difficulty or complication.  Traction was let   off and retractors were placed posterior and anterior around the   acetabulum.      The labrum and foveal tissue were debrided.  I began reaming with a 45mm   reamer and reamed up to 53mm reamer with good bony bed preparation and a 54   cup was chosen.  The final 54mm Pinnacle cup was then impacted under fluoroscopy  to  confirm the depth of penetration and orientation with respect to   abduction.  A screw was placed followed by the hole eliminator.  The final   36+4 neutral Altrex liner was impacted with good visualized rim fit.  The cup was positioned anatomically within the acetabular portion of the pelvis.      At this point, the femur was rolled at 80 degrees.  Further capsule was   released off the inferior aspect of the femoral neck.  I then   released the superior capsule proximally.  The hook was placed laterally   along the femur and elevated manually and held in position with the bed   hook.  The leg was then extended and adducted with the leg rolled to 100   degrees of external rotation.  Once the proximal femur was fully   exposed, I used a box osteotome to set orientation.  I then began   broaching with the starting chili pepper broach and passed this by hand and then broached up to 7.  With the 7 broach in place I chose a high offset neck and did a trial reduction with a 36+1.5 trial head ball.  The offset was appropriate, leg lengths   appeared to be equal, confirmed radiographically.   Given these findings, I went ahead and dislocated the hip, repositioned all   retractors and positioned the right hip in the extended and abducted position.  The final 7 Hi Tri Lock stem was   chosen and it was impacted down to the level of neck cut.  Based on this   and the trial reduction, a 36+1.5 delta ceramic ball was chosen and   impacted onto a clean and dry trunnion, and the hip was reduced.  The   hip had been irrigated throughout the case again at this point.  I did   reapproximate the superior capsular leaflet to the anterior leaflet   using #1 Vicryl, placed a medium Hemovac drain deep.  The fascia of the   tensor fascia lata muscle was then reapproximated using #1 Vicryl.  The   remaining wound was closed with 2-0 Vicryl and running 4-0 Monocryl.   The hip was cleaned, dried, and dressed sterilely  using Dermabond and   Aquacel dressing.  Drain  site dressed separately.  She was then brought   to recovery room in stable condition tolerating the procedure well.    Lanney Gins, PA-C was present for the entirety of the case involved from   preoperative positioning, perioperative retractor management, general   facilitation of the case, as well as primary wound closure as assistant.            Madlyn Frankel Charlann Boxer, M.D.            MDO/MEDQ  D:  11/10/2010  T:  11/10/2010  Job:  [id number]      Electronically Signed by Durene Romans M.D. on 11/16/2010 09:15:38 AM

## 2012-05-02 NOTE — Transfer of Care (Signed)
Immediate Anesthesia Transfer of Care Note  Patient: Patrick [name]  Procedure(s) Performed: Procedure(s): LEFT TOTAL HIP ARTHROPLASTY ANTERIOR APPROACH (Left)  Patient Location: PACU  Anesthesia Type:Spinal  Level of Consciousness: awake, alert , oriented and patient cooperative  Airway & Oxygen Therapy: Patient Spontanous Breathing and Patient connected to face mask oxygen  Post-op Assessment: Report given to PACU RN and Post -op Vital signs reviewed and stable  Post vital signs: Reviewed and stable  Complications: No apparent anesthesia complications

## 2012-05-02 NOTE — Anesthesia Procedure Notes (Signed)
Spinal Patient location during procedure: OR Staffing Performed by: anesthesiologist  Preanesthetic Checklist Completed: patient identified, site marked, surgical consent, pre-op evaluation, timeout performed, IV checked, risks and benefits discussed and monitors and equipment checked Spinal Block Patient position: sitting Prep: Betadine Patient monitoring: heart rate, continuous pulse ox and blood pressure Location: L2-3 Injection technique: single-shot Needle Needle type: Sprotte  Needle gauge: 24 G Needle length: 9 cm Additional Notes Expiration date of kit checked and confirmed. Patient tolerated procedure well, without complications.     

## 2012-05-02 NOTE — Interval H&P Note (Signed)
History and Physical Interval Note:  05/02/2012 11:28 AM  [name]  has presented today for surgery, with the diagnosis of OA LEFT HIP   The various methods of treatment have been discussed with the patient and family. After consideration of risks, benefits and other options for treatment, the patient has consented to  Procedure(s): LEFT TOTAL HIP ARTHROPLASTY ANTERIOR APPROACH (Left) as a surgical intervention .  The patient's history has been reviewed, patient examined, no change in status, stable for surgery.  I have reviewed the patient's chart and labs.  Questions were answered to the patient's satisfaction.     Patrick [name]

## 2012-05-03 ENCOUNTER — Encounter (HOSPITAL_COMMUNITY): Payer: Self-pay | Admitting: Orthopedic Surgery

## 2012-05-03 DIAGNOSIS — E663 Overweight: Secondary | ICD-10-CM

## 2012-05-03 LAB — CBC
HCT: 39.7 % (ref 39.0–52.0)
Hemoglobin: 13.8 g/dL (ref 13.0–17.0)
MCH: 30.8 pg (ref 26.0–34.0)
MCHC: 34.8 g/dL (ref 30.0–36.0)
MCV: 88.6 fL (ref 78.0–100.0)
Platelets: 157 10*3/uL (ref 150–400)
RBC: 4.48 MIL/uL (ref 4.22–5.81)
RDW: 12.8 % (ref 11.5–15.5)
WBC: 11.3 10*3/uL — ABNORMAL HIGH (ref 4.0–10.5)

## 2012-05-03 LAB — BASIC METABOLIC PANEL
BUN: 17 mg/dL (ref 6–23)
CO2: 29 mEq/L (ref 19–32)
Calcium: 8.7 mg/dL (ref 8.4–10.5)
Chloride: 105 mEq/L (ref 96–112)
Creatinine, Ser: 1.06 mg/dL (ref 0.50–1.35)
GFR calc Af Amer: >90 mL/min (ref >90)
GFR calc non Af Amer: 80 mL/min — ABNORMAL LOW (ref >90)
Glucose, Bld: 153 mg/dL — ABNORMAL HIGH (ref 70–99)
Potassium: 4.4 mEq/L (ref 3.5–5.1)
Sodium: 141 mEq/L (ref 135–145)

## 2012-05-03 MED ORDER — OXYCODONE HCL 5 MG PO TABS: 5.0000 mg | ORAL_TABLET | ORAL | Status: DC | PRN

## 2012-05-03 MED ORDER — FERROUS SULFATE 325 (65 FE) MG PO TABS: 325.0000 mg | ORAL_TABLET | Freq: Three times a day (TID) | ORAL | Status: DC

## 2012-05-03 MED ORDER — TIZANIDINE HCL 4 MG PO CAPS: 4.0000 mg | ORAL_CAPSULE | Freq: Three times a day (TID) | ORAL | Status: DC

## 2012-05-03 MED ORDER — POLYETHYLENE GLYCOL 3350 17 G PO PACK: 17.0000 g | PACK | Freq: Two times a day (BID) | ORAL | Status: DC

## 2012-05-03 MED ORDER — RIVAROXABAN 10 MG PO TABS: 10.0000 mg | ORAL_TABLET | ORAL | Status: DC

## 2012-05-03 MED ORDER — DSS 100 MG PO CAPS: 100.0000 mg | ORAL_CAPSULE | Freq: Two times a day (BID) | ORAL | Status: DC

## 2012-05-03 NOTE — Progress Notes (Signed)
Utilization review completed.  

## 2012-05-03 NOTE — Evaluation (Signed)
Physical Therapy Evaluation Patient Details Name: Patrick [name] MRN: [id number] DOB: June 22, 1960 Today's Date: 05/03/2012 Time: 1478-2956 PT Time Calculation (min): 23 min  PT Assessment / Plan / Recommendation Clinical Impression  52 yo male s/p L THA-direct anterior. On eval, pt was Min-guard assist for mobility-able to ambulate ~150 feet with RW. Anticipate pt will continue to progress well. Recommend HHPT. Will plan to see pt for 1 more session to practice stair negotiation prior to d/c.     PT Assessment  Patient needs continued PT services    Follow Up Recommendations  Home health PT    Does the patient have the potential to tolerate intense rehabilitation      Barriers to Discharge        Equipment Recommendations  Rolling walker with 5" wheels    Recommendations for Other Services OT consult   Frequency 7X/week    Precautions / Restrictions Precautions Precautions: None Restrictions Weight Bearing Restrictions: No LLE Weight Bearing: Weight bearing as tolerated   Pertinent Vitals/Pain 4/10 L hip with activity; 2/10 at rest      Mobility  Bed Mobility Bed Mobility: Supine to Sit Supine to Sit: 4: Min guard;HOB elevated Details for Bed Mobility Assistance: Increased time.  Transfers Transfers: Sit to Stand;Stand to Sit Sit to Stand: 5: Supervision;From bed Stand to Sit: 5: Supervision;To chair/3-in-1 Details for Transfer Assistance: VCs safety, technique, hand placement.  Ambulation/Gait Ambulation/Gait Assistance: 5: Supervision Ambulation Distance (Feet): 150 Feet Assistive device: Rolling walker Ambulation/Gait Assistance Details: VCS safety, technique Gait Pattern: Step-through pattern;Antalgic;Decreased stride length    Exercises Total Joint Exercises Ankle Circles/Pumps: AROM;Both;10 reps;Seated Quad Sets: AROM;Both;10 reps;Seated Hip ABduction/ADduction: AROM;Left;10 reps;Standing Knee Flexion: AROM;Left;10 reps;Standing Marching in  Standing: AROM;Left;5 reps;Standing (only able to acheive ~ 45 degrees hip flex)   PT Diagnosis: Difficulty walking;Abnormality of gait;Acute pain  PT Problem List: Decreased strength;Decreased range of motion;Decreased activity tolerance;Decreased mobility;Pain;Decreased knowledge of use of DME;Decreased knowledge of precautions PT Treatment Interventions: DME instruction;Gait training;Stair training;Functional mobility training;Therapeutic activities;Therapeutic exercise;Patient/family education   PT Goals Acute Rehab PT Goals PT Goal Formulation: With patient Time For Goal Achievement: 05/10/12 Potential to Achieve Goals: Good Pt will go Supine/Side to Sit: with modified independence PT Goal: Supine/Side to Sit - Progress: Goal set today Pt will go Sit to Supine/Side: with modified independence PT Goal: Sit to Supine/Side - Progress: Goal set today Pt will go Sit to Stand: with modified independence PT Goal: Sit to Stand - Progress: Goal set today Pt will Ambulate: >150 feet;with modified independence;with rolling walker PT Goal: Ambulate - Progress: Goal set today Pt will Go Up / Down Stairs: 6-9 stairs;with rail(s);with least restrictive assistive device Pt will Perform Home Exercise Program: with supervision, verbal cues required/provided PT Goal: Perform Home Exercise Program - Progress: Goal set today  Visit Information  Last PT Received On: 05/03/12 Assistance Needed: +1    Subjective Data  Subjective: Im a little surprised Im leaving today Patient Stated Goal: home   Prior Functioning  Home Living Lives With: Spouse;Family Available Help at Discharge: Family Type of Home: House Home Access: Stairs to enter Secretary/administrator of Steps: 1 Entrance Stairs-Rails: None Home Layout: Two level;Bed/bath upstairs Alternate Level Stairs-Number of Steps: 1 flight Alternate Level Stairs-Rails: Right Bathroom Shower/Tub: Health visitor: Standard Home  Adaptive Equipment: Shower chair without back;Walker - rolling;Bedside commode/3-in-1 Prior Function Level of Independence: Independent Able to Take Stairs?: Yes Driving: Yes Communication Communication: No difficulties    Cognition  Cognition Arousal/Alertness:  Awake/alert Behavior During Therapy: WFL for tasks assessed/performed Overall Cognitive Status: Within Functional Limits for tasks assessed    Extremity/Trunk Assessment Right Lower Extremity Assessment RLE ROM/Strength/Tone: [facility] for tasks assessed Left Lower Extremity Assessment LLE ROM/Strength/Tone: Deficits LLE ROM/Strength/Tone Deficits: hip flex 3-/5, hip abd/add 3/5, knee ext at least 3/5, moves ankle well LLE Sensation: WFL - Light Touch Trunk Assessment Trunk Assessment: Normal   Balance    End of Session PT - End of Session Activity Tolerance: Patient tolerated treatment well Patient left: in chair;with call bell/phone within reach  GP     Thomes Lolling Pager: [phone]

## 2012-05-03 NOTE — Progress Notes (Signed)
Physical Therapy Treatment Patient Details Name: Patrick [name] MRN: [id number] DOB: 02-04-1960 Today's Date: 05/03/2012 Time: 4098-1191 PT Time Calculation (min): 13 min  PT Assessment / Plan / Recommendation Comments on Treatment Session  2nd session. Practiced steps. All education completed. Recommend HHPT. Ready to d/c from PT standpoint    Follow Up Recommendations  Home health PT     Does the patient have the potential to tolerate intense rehabilitation     Barriers to Discharge        Equipment Recommendations  Rolling walker with 5" wheels    Recommendations for Other Services OT consult  Frequency 7X/week   Plan Discharge plan remains appropriate    Precautions / Restrictions Precautions Precautions: None Restrictions Weight Bearing Restrictions: No LLE Weight Bearing: Weight bearing as tolerated   Pertinent Vitals/Pain 4/10 L hip    Mobility  Transfers Transfers: Sit to Stand;Stand to Sit Sit to Stand: 5: Supervision;From chair/3-in-1 Stand to Sit: 5: Supervision;To chair/3-in-1 Details for Transfer Assistance: VCs safety, technique, hand placement.  Ambulation/Gait Ambulation/Gait Assistance: 5: Supervision Ambulation Distance (Feet): 75 Feet Assistive device: Rolling walker Ambulation/Gait Assistance Details: VCS safety, technique Gait Pattern: Step-through pattern Stairs: Yes Stairs Assistance: 5: Supervision Stairs Assistance Details (indicate cue type and reason): VCs safety, sequence, technique. Verbally discussed and demonstrated 1 step technique to enter home with walker.  Stair Management Technique: One rail Right;Step to pattern;Forwards;With crutches Number of Stairs: 9    Exercises    PT Diagnosis: Difficulty walking;Abnormality of gait;Acute pain  PT Problem List: Decreased strength;Decreased range of motion;Decreased activity tolerance;Decreased mobility;Pain;Decreased knowledge of use of DME;Decreased knowledge of precautions PT  Treatment Interventions: DME instruction;Gait training;Stair training;Functional mobility training;Therapeutic activities;Therapeutic exercise;Patient/family education   PT Goals Acute Rehab PT Goals PT Goal Formulation: With patient Time For Goal Achievement: 05/10/12 Potential to Achieve Goals: Good Pt will go Supine/Side to Sit: with modified independence PT Goal: Supine/Side to Sit - Progress: Goal set today Pt will go Sit to Supine/Side: with modified independence PT Goal: Sit to Supine/Side - Progress: Goal set today Pt will go Sit to Stand: with modified independence PT Goal: Sit to Stand - Progress: Progressing toward goal Pt will Ambulate: >150 feet;with modified independence;with rolling walker PT Goal: Ambulate - Progress: Progressing toward goal Pt will Go Up / Down Stairs: 6-9 stairs;with rail(s);with least restrictive assistive device;with supervision PT Goal: Up/Down Stairs - Progress: Met Pt will Perform Home Exercise Program: with supervision, verbal cues required/provided PT Goal: Perform Home Exercise Program - Progress: Goal set today  Visit Information  Last PT Received On: 05/03/12 Assistance Needed: +1    Subjective Data  Subjective: Im a little surprised Im leaving today Patient Stated Goal: home   Cognition  Cognition Arousal/Alertness: Awake/alert Behavior During Therapy: WFL for tasks assessed/performed Overall Cognitive Status: Within Functional Limits for tasks assessed    Balance     End of Session PT - End of Session Equipment Utilized During Treatment: Gait belt Activity Tolerance: Patient tolerated treatment well Patient left: in chair;with call bell/phone within reach   GP     Rebeca Alert, PT Pager: [phone]

## 2012-05-03 NOTE — Progress Notes (Signed)
OT Screen Note  Patient Details Name: Patrick [name] MRN: [id number] DOB: 1960/09/23   Cancelled Treatment:    Reason Eval/Treat Not Completed: Other (comment)  Pt has all dme and help at home.  No needs identified.    Pritika Alvarez 05/03/2012, 12:04 PM Marica Otter, OTR/L [phone] 05/03/2012

## 2012-05-03 NOTE — Progress Notes (Signed)
   Subjective: 1 Day Post-Op Procedure(s) (LRB): LEFT TOTAL HIP ARTHROPLASTY ANTERIOR APPROACH (Left)   Patient reports pain as mild, pain well controlled. No events throughout the night. Ready to be discharged home.  Objective:   VITALS:   Filed Vitals:   05/03/12 0625  BP: 131/87  Pulse: 96  Temp: 97.6 F (36.4 C)  Resp: 14    Neurovascular intact Dorsiflexion/Plantar flexion intact Incision: dressing C/D/I No cellulitis present Compartment soft  LABS  Recent Labs  05/03/12 0453  HGB 13.8  HCT 39.7  WBC 11.3*  PLT 157     Recent Labs  05/03/12 0453  NA 141  K 4.4  BUN 17  CREATININE 1.06  GLUCOSE 153*     Assessment/Plan: 1 Day Post-Op Procedure(s) (LRB): LEFT TOTAL HIP ARTHROPLASTY ANTERIOR APPROACH (Left) HV drain d/c'ed Foley cath d/c'ed Advance diet Up with therapy D/C IV fluids Discharge home with home health Follow up in 2 weeks at [facility]. Follow up with OLIN,Abbygail [place] D in 2 weeks.  Contact information:  [facility] [address], [address] [place] [address] [phone]    Overweight (BMI 25-29.9)  Estimated body mass index is 28.7 kg/(m^2) as calculated from the following:   Height as of this encounter: 5\' 10"  (1.778 m).   Weight as of this encounter: 90.719 kg (200 lb). Patient also counseled that weight may inhibit the healing process Patient counseled that losing weight will help with future health issues      Anastasio Auerbach. Thierry Dobosz   PAC  05/03/2012, 9:18 AM

## 2012-05-04 NOTE — Progress Notes (Signed)
Discharge summary sent to payer through [facility]  

## 2012-05-04 NOTE — Discharge Summary (Signed)
Physician Discharge Summary  Patient ID: Patrick [name] MRN: [id number] DOB/AGE: 27-Sep-1960 5 y.o.  Admit date: 05/02/2012 Discharge date: 05/03/2012   Procedures:  Procedure(s) (LRB): LEFT TOTAL HIP ARTHROPLASTY ANTERIOR APPROACH (Left)  Attending Physician:  Dr. Durene Romans   Admission Diagnoses:   Left hip OA / pain  Discharge Diagnoses:  Principal Problem:   S/P left THA, AA Active Problems:   Overweight (BMI 25.0-29.9)  Diagnosis  . Hypertension  . Hypothyroidism  . Depression  . GERD (gastroesophageal reflux disease)  . Arthritis  . History of blood transfusion  . DDD (degenerative disc disease), lumbar  . Environmental allergies    HPI: Patrick [name], 52 y.o. male, has a history of pain and functional disability in the left hip(s) due to arthritis and patient has failed non-surgical conservative treatments for greater than 12 weeks to include NSAID's and/or analgesics and activity modification. Onset of symptoms was gradual starting 2 years ago with gradually worsening course since that time.The patient noted no past surgery on the left hip(s). Patient currently rates pain in the left hip at 8 out of 10 with activity. Patient has worsening of pain with activity and weight bearing, trendelenberg gait, pain that interfers with activities of daily living and pain with passive range of motion. Patient has evidence of periarticular osteophytes and joint space narrowing by imaging studies. This condition presents safety issues increasing the risk of falls. There is no current active infection. Risks, benefits and expectations were discussed with the patient. Patient understand the risks, benefits and expectations and wishes to proceed with surgery.  PCP: Patrick Saupe, MD   Discharged Condition: good  Hospital Course:  Patient underwent the above stated procedure on 05/02/2012. Patient tolerated the procedure well and brought to the recovery room in good condition  and subsequently to the floor.  POD #1 BP: 131/87 ; Pulse: 96 ; Temp: 97.6 F (36.4 C) ; Resp: 14  Pt's foley was removed, as well as the hemovac drain removed. IV was changed to a saline lock. Patient reports pain as mild, pain well controlled. No events throughout the night. Ready to be discharged home. Neurovascular intact, dorsiflexion/plantar flexion intact, incision: dressing C/[name]/I, no cellulitis present and compartment soft.   LABS  Basename  05/03/12    0453  HGB  13.8  HCT  39.7    Discharge Exam: General appearance: alert, cooperative and no distress Extremities: Homans sign is negative, no sign of DVT, no edema, redness or tenderness in the calves or thighs and no ulcers, gangrene or trophic changes  Disposition:   Home-Health Care Svc with follow up in 2 weeks  Follow up in 2 weeks at [facility]. Follow up with Patrick [name] in 2 weeks.  Contact information:  [facility] [address], [address] [place] [address] [phone]     Discharge Orders   Future Orders Complete By Expires     Call MD / Call 911  As directed     Comments:      If you experience chest pain or shortness of breath, CALL 911 and be transported to the hospital emergency room.  If you develope a fever above 101 F, pus (white drainage) or increased drainage or redness at the wound, or calf pain, call your surgeon's office.    Change dressing  As directed     Comments:      Maintain surgical dressing for 10-14 days, then replace with 4x4 guaze and tape. Keep  the area dry and clean.    Constipation Prevention  As directed     Comments:      Drink plenty of fluids.  Prune juice may be helpful.  You may use a stool softener, such as Colace (over the counter) 100 mg twice a day.  Use MiraLax (over the counter) for constipation as needed.    Diet - low sodium heart healthy  As directed     Discharge instructions  As directed     Comments:       Maintain surgical dressing for 10-14 days, then replace with gauze and tape. Keep the area dry and clean until follow up. Follow up in 2 weeks at [facility]. Call with any questions or concerns.    Increase activity slowly as tolerated  As directed     TED hose  As directed     Comments:      Use stockings (TED hose) for 2 weeks on both leg(s).  You may remove them at night for sleeping.    Weight bearing as tolerated  As directed          Medication List    STOP taking these medications       HYDROcodone-acetaminophen 7.5-500 MG per tablet  Commonly known as:  LORTAB     naproxen sodium 220 MG tablet  Commonly known as:  ANAPROX      TAKE these medications       amLODipine 5 MG tablet  Commonly known as:  NORVASC  Take 5 mg by mouth every evening.     amphetamine-dextroamphetamine 30 MG tablet  Commonly known as:  ADDERALL  Take 30 mg by mouth 2 (two) times daily.     buPROPion 300 MG 24 hr tablet  Commonly known as:  WELLBUTRIN XL  Take 300 mg by mouth daily before breakfast.     DSS 100 MG Caps  Take 100 mg by mouth 2 (two) times daily.     ferrous sulfate 325 (65 FE) MG tablet  Take 1 tablet (325 mg total) by mouth 3 (three) times daily after meals.     levothyroxine 50 MCG tablet  Commonly known as:  SYNTHROID, LEVOTHROID  Take 50 mcg by mouth daily before breakfast.     loratadine 10 MG tablet  Commonly known as:  CLARITIN  Take 10 mg by mouth daily.     multivitamin with minerals Tabs  Take 1 tablet by mouth daily.     omeprazole 20 MG capsule  Commonly known as:  PRILOSEC  Take 20 mg by mouth daily.     oxyCODONE 5 MG immediate release tablet  Commonly known as:  Oxy IR/ROXICODONE  Take 1-3 tablets (5-15 mg total) by mouth every 4 (four) hours as needed for pain.     polyethylene glycol packet  Commonly known as:  MIRALAX / GLYCOLAX  Take 17 g by mouth 2 (two) times daily.     propranolol 80 MG 24 hr capsule  Commonly known as:   INNOPRAN XL  Take 80 mg by mouth daily before breakfast.     quinapril 20 MG tablet  Commonly known as:  ACCUPRIL  Take 20 mg by mouth 2 (two) times daily.     rivaroxaban 10 MG Tabs tablet  Commonly known as:  XARELTO  Take 1 tablet (10 mg total) by mouth daily.     tiZANidine 4 MG capsule  Commonly known as:  ZANAFLEX  Take 1 capsule (4 mg total)  by mouth 3 (three) times daily. Muscle spasms     VIIBRYD 40 MG Tabs  Generic drug:  Vilazodone HCl  Take 40 mg by mouth daily before breakfast.         Signed: Anastasio Auerbach. Jeannette Maddy   PAC  05/04/2012, 9:05 AM

## 2012-05-11 NOTE — Anesthesia Postprocedure Evaluation (Signed)
  Anesthesia Post-op Note  Patient: Patrick [name]  Procedure(s) Performed: Procedure(s) (LRB): LEFT TOTAL HIP ARTHROPLASTY ANTERIOR APPROACH (Left)  Patient Location: PACU  Anesthesia Type: Spinal  Level of Consciousness: awake and alert   Airway and Oxygen Therapy: Patient Spontanous Breathing  Post-op Pain: mild  Post-op Assessment: Post-op Vital signs reviewed, Patient's Cardiovascular Status Stable, Respiratory Function Stable, Patent Airway and No signs of Nausea or vomiting  Last Vitals:  Filed Vitals:   05/03/12 0800  BP:   Pulse:   Temp:   Resp: 18    Post-op Vital Signs: stable   Complications: No apparent anesthesia complications

## 2014-01-18 HISTORY — PX: COLONOSCOPY: SHX174

## 2015-09-24 DIAGNOSIS — M7022 Olecranon bursitis, left elbow: Secondary | ICD-10-CM | POA: Diagnosis not present

## 2015-09-24 DIAGNOSIS — S43431A Superior glenoid labrum lesion of right shoulder, initial encounter: Secondary | ICD-10-CM | POA: Diagnosis not present

## 2015-10-03 DIAGNOSIS — M7022 Olecranon bursitis, left elbow: Secondary | ICD-10-CM | POA: Diagnosis not present

## 2015-10-03 DIAGNOSIS — S43431D Superior glenoid labrum lesion of right shoulder, subsequent encounter: Secondary | ICD-10-CM | POA: Diagnosis not present

## 2015-10-16 DIAGNOSIS — F339 Major depressive disorder, recurrent, unspecified: Secondary | ICD-10-CM | POA: Diagnosis not present

## 2015-10-22 DIAGNOSIS — Z79899 Other long term (current) drug therapy: Secondary | ICD-10-CM | POA: Diagnosis not present

## 2015-10-22 DIAGNOSIS — E785 Hyperlipidemia, unspecified: Secondary | ICD-10-CM | POA: Diagnosis not present

## 2015-10-22 DIAGNOSIS — M25522 Pain in left elbow: Secondary | ICD-10-CM | POA: Diagnosis not present

## 2015-10-22 DIAGNOSIS — E039 Hypothyroidism, unspecified: Secondary | ICD-10-CM | POA: Diagnosis not present

## 2015-10-22 DIAGNOSIS — M009 Pyogenic arthritis, unspecified: Secondary | ICD-10-CM | POA: Diagnosis not present

## 2015-10-22 DIAGNOSIS — Z23 Encounter for immunization: Secondary | ICD-10-CM | POA: Diagnosis not present

## 2015-10-22 DIAGNOSIS — I10 Essential (primary) hypertension: Secondary | ICD-10-CM | POA: Diagnosis not present

## 2015-10-22 DIAGNOSIS — R05 Cough: Secondary | ICD-10-CM | POA: Diagnosis not present

## 2015-11-13 DIAGNOSIS — F339 Major depressive disorder, recurrent, unspecified: Secondary | ICD-10-CM | POA: Diagnosis not present

## 2015-12-05 DIAGNOSIS — I10 Essential (primary) hypertension: Secondary | ICD-10-CM | POA: Diagnosis not present

## 2015-12-16 DIAGNOSIS — I10 Essential (primary) hypertension: Secondary | ICD-10-CM | POA: Diagnosis not present

## 2015-12-16 DIAGNOSIS — F339 Major depressive disorder, recurrent, unspecified: Secondary | ICD-10-CM | POA: Diagnosis not present

## 2016-01-15 DIAGNOSIS — F339 Major depressive disorder, recurrent, unspecified: Secondary | ICD-10-CM | POA: Diagnosis not present

## 2016-08-10 ENCOUNTER — Ambulatory Visit: Payer: Managed Care, Other (non HMO) | Admitting: Family Medicine

## 2016-08-11 ENCOUNTER — Ambulatory Visit (INDEPENDENT_AMBULATORY_CARE_PROVIDER_SITE_OTHER): Payer: BLUE CROSS/BLUE SHIELD | Admitting: Family Medicine

## 2016-08-11 ENCOUNTER — Encounter: Payer: Self-pay | Admitting: Family Medicine

## 2016-08-11 VITALS — BP 128/98 | HR 70 | Temp 98.3°F | Ht 70.0 in | Wt 220.0 lb

## 2016-08-11 DIAGNOSIS — F418 Other specified anxiety disorders: Secondary | ICD-10-CM | POA: Diagnosis not present

## 2016-08-11 DIAGNOSIS — E890 Postprocedural hypothyroidism: Secondary | ICD-10-CM

## 2016-08-11 DIAGNOSIS — M1000 Idiopathic gout, unspecified site: Secondary | ICD-10-CM

## 2016-08-11 DIAGNOSIS — F900 Attention-deficit hyperactivity disorder, predominantly inattentive type: Secondary | ICD-10-CM

## 2016-08-11 DIAGNOSIS — E663 Overweight: Secondary | ICD-10-CM | POA: Diagnosis not present

## 2016-08-11 DIAGNOSIS — E039 Hypothyroidism, unspecified: Secondary | ICD-10-CM | POA: Insufficient documentation

## 2016-08-11 DIAGNOSIS — Z9109 Other allergy status, other than to drugs and biological substances: Secondary | ICD-10-CM

## 2016-08-11 DIAGNOSIS — I10 Essential (primary) hypertension: Secondary | ICD-10-CM | POA: Diagnosis not present

## 2016-08-11 DIAGNOSIS — M109 Gout, unspecified: Secondary | ICD-10-CM | POA: Insufficient documentation

## 2016-08-11 DIAGNOSIS — K219 Gastro-esophageal reflux disease without esophagitis: Secondary | ICD-10-CM

## 2016-08-11 MED ORDER — AMPHETAMINE-DEXTROAMPHET ER 10 MG PO CP24: ORAL_CAPSULE | ORAL | 0 refills | Status: DC

## 2016-08-11 MED ORDER — AMPHETAMINE-DEXTROAMPHET ER 30 MG PO CP24: 30.0000 mg | ORAL_CAPSULE | ORAL | 0 refills | Status: DC

## 2016-08-11 MED ORDER — VORTIOXETINE HBR 20 MG PO TABS: 20.0000 mg | ORAL_TABLET | Freq: Every day | ORAL | 0 refills | Status: DC

## 2016-08-11 NOTE — Patient Instructions (Signed)
WE NOW OFFER   [facility] FAST TRACK!!!  SAME DAY Appointments for ACUTE CARE  Such as: Sprains, Injuries, cuts, abrasions, rashes, muscle pain, joint pain, back pain Colds, flu, sore throats, headache, allergies, cough, fever  Ear pain, sinus and eye infections Abdominal pain, nausea, vomiting, diarrhea, upset stomach Animal/insect bites  3 Easy Ways to Schedule: Walk-In Scheduling Call in scheduling Mychart Sign-up: [email or web address]         

## 2016-08-11 NOTE — Progress Notes (Signed)
   Subjective:    Patient ID: Laury DeepPer-Ake J Vogan, male    DOB: 07/12/1960, 56 y.o.   MRN: [id number]  HPI 56 yr old male to establish with us after [facility]. He feels well today. He checks his BP at home frequently and it remains in the 130s over 80s. He sees Dr. Evelene CroonKaur for treatment of depression and ADHD, and these are well controlled. He meets with his psychotherapist once a month. He has had 2 attacks of gout in the foot this year but this seems to be stable. His last physical was about a year ago.    Review of Systems  Constitutional: Negative.   Respiratory: Negative.   Cardiovascular: Negative.   Gastrointestinal: Negative.   Endocrine: Negative.   Musculoskeletal: Negative.   Neurological: Negative.   Psychiatric/Behavioral: Negative.        Objective:   Physical Exam  Constitutional: He is oriented to person, place, and time. He appears well-developed and well-nourished.  Neck: No thyromegaly present.  Cardiovascular: Normal rate, regular rhythm, normal heart sounds and intact distal pulses.   Pulmonary/Chest: Effort normal and breath sounds normal. No respiratory distress. He has no wheezes. He has no rales.  Lymphadenopathy:    He has no cervical adenopathy.  Neurological: He is alert and oriented to person, place, and time.  Psychiatric: He has a normal mood and affect. His behavior is normal. Thought content normal.          Assessment & Plan:  Introductory visit for this man with stable issues. We will get his medical records sent over to review.  Gershon CraneStephen Jemmie Rhinehart, MD

## 2016-09-23 ENCOUNTER — Telehealth: Payer: Self-pay | Admitting: Family Medicine

## 2016-09-23 MED ORDER — OMEPRAZOLE 20 MG PO CPDR
20.0000 mg | DELAYED_RELEASE_CAPSULE | Freq: Every day | ORAL | 3 refills | Status: DC
Start: 2016-09-23 — End: 2017-08-29

## 2016-09-23 NOTE — Telephone Encounter (Signed)
Call in #90 with 3 rf  

## 2016-09-23 NOTE — Telephone Encounter (Signed)
I sent script e-scribe to [facility]. 

## 2016-09-23 NOTE — Telephone Encounter (Signed)
Can we refill this? Not sure if you have prescribed this for pt?

## 2016-09-23 NOTE — Telephone Encounter (Signed)
Refill request for Omeprazole 20 mg and send to [facility].

## 2016-11-01 ENCOUNTER — Telehealth: Payer: Self-pay | Admitting: Family Medicine

## 2016-11-01 NOTE — Telephone Encounter (Signed)
Pt needs new rxs amlodipine 5 mg twice a day #180, hctz 25 mg #90. [facility]

## 2016-11-01 NOTE — Telephone Encounter (Signed)
Can we refill this? 

## 2016-11-01 NOTE — Telephone Encounter (Signed)
Refill both for one year. 

## 2016-11-03 MED ORDER — AMLODIPINE BESYLATE 5 MG PO TABS: 5.0000 mg | ORAL_TABLET | Freq: Two times a day (BID) | ORAL | 3 refills | Status: DC

## 2016-11-03 MED ORDER — HYDROCHLOROTHIAZIDE 25 MG PO TABS: 25.0000 mg | ORAL_TABLET | Freq: Every day | ORAL | 3 refills | Status: DC

## 2016-11-03 NOTE — Telephone Encounter (Signed)
I sent both scripts e-scribe to [facility]. 

## 2016-12-27 ENCOUNTER — Ambulatory Visit: Payer: BLUE CROSS/BLUE SHIELD | Admitting: Family Medicine

## 2016-12-28 ENCOUNTER — Ambulatory Visit: Payer: BLUE CROSS/BLUE SHIELD | Admitting: Family Medicine

## 2016-12-31 ENCOUNTER — Encounter: Payer: Self-pay | Admitting: Family Medicine

## 2016-12-31 ENCOUNTER — Ambulatory Visit: Payer: BLUE CROSS/BLUE SHIELD | Admitting: Family Medicine

## 2016-12-31 VITALS — BP 102/80 | HR 96 | Temp 98.1°F | Wt 225.2 lb

## 2016-12-31 DIAGNOSIS — K219 Gastro-esophageal reflux disease without esophagitis: Secondary | ICD-10-CM

## 2016-12-31 DIAGNOSIS — F418 Other specified anxiety disorders: Secondary | ICD-10-CM

## 2016-12-31 DIAGNOSIS — I10 Essential (primary) hypertension: Secondary | ICD-10-CM

## 2016-12-31 DIAGNOSIS — Z23 Encounter for immunization: Secondary | ICD-10-CM

## 2016-12-31 MED ORDER — LOSARTAN POTASSIUM 100 MG PO TABS: 100.0000 mg | ORAL_TABLET | Freq: Every day | ORAL | 3 refills | Status: DC

## 2016-12-31 MED ORDER — PROPRANOLOL HCL ER BEADS 80 MG PO CP24: 80.0000 mg | ORAL_CAPSULE | Freq: Every day | ORAL | 3 refills | Status: DC

## 2016-12-31 NOTE — Progress Notes (Signed)
   Subjective:    Patient ID: Patrick [name], male    DOB: 11/18/1960, 56 y.o.   MRN: [id number]  HPI Here for immunizations and the check on BP. He feels fine.    Review of Systems  Constitutional: Negative.   Respiratory: Negative.   Cardiovascular: Negative.   Neurological: Negative.        Objective:   Physical Exam  Constitutional: He is oriented to person, place, and time. He appears well-developed and well-nourished.  Neck: No thyromegaly present.  Cardiovascular: Normal rate, regular rhythm, normal heart sounds and intact distal pulses.  Pulmonary/Chest: Effort normal and breath sounds normal. No respiratory distress. He has no wheezes. He has no rales.  Musculoskeletal: He exhibits no edema.  Lymphadenopathy:    He has no cervical adenopathy.  Neurological: He is alert and oriented to person, place, and time.          Assessment & Plan:  He is doing well. HTN is well controlled. He was given a flu shot and a TDaP. Check a BMET today. Gershon CraneStephen Fry, MD

## 2017-01-01 LAB — BASIC METABOLIC PANEL
BUN/Creatinine Ratio: 12 (calc) (ref 6–22)
BUN: 17 mg/dL (ref 7–25)
CO2: 28 mmol/L (ref 20–32)
Calcium: 9.5 mg/dL (ref 8.6–10.3)
Chloride: 103 mmol/L (ref 98–110)
Creat: 1.45 mg/dL — ABNORMAL HIGH (ref 0.70–1.33)
Glucose, Bld: 97 mg/dL (ref 65–99)
Potassium: 3.7 mmol/L (ref 3.5–5.3)
Sodium: 144 mmol/L (ref 135–146)

## 2017-01-27 ENCOUNTER — Other Ambulatory Visit: Payer: Self-pay

## 2017-01-27 MED ORDER — LEVOTHYROXINE SODIUM 50 MCG PO TABS: 50.0000 ug | ORAL_TABLET | Freq: Every day | ORAL | 0 refills | Status: DC

## 2017-04-21 DIAGNOSIS — F3342 Major depressive disorder, recurrent, in full remission: Secondary | ICD-10-CM | POA: Diagnosis not present

## 2017-04-21 DIAGNOSIS — F900 Attention-deficit hyperactivity disorder, predominantly inattentive type: Secondary | ICD-10-CM | POA: Diagnosis not present

## 2017-04-26 ENCOUNTER — Other Ambulatory Visit: Payer: Self-pay | Admitting: Family Medicine

## 2017-04-27 NOTE — Telephone Encounter (Signed)
Pt will need an OV for more refilled need to recheck pt's TSH level thanks

## 2017-07-25 ENCOUNTER — Other Ambulatory Visit: Payer: Self-pay | Admitting: Family Medicine

## 2017-07-26 NOTE — Telephone Encounter (Signed)
Thyroid was last tested in 2011   Last OV 12/31/2016   Last refilled 04/2017 disp 90 with no refills   Sent to PCP for approval for 30 or 90 day supply will inform pt that an appt is needed for more refills

## 2017-08-29 ENCOUNTER — Encounter: Payer: Self-pay | Admitting: Family Medicine

## 2017-08-29 ENCOUNTER — Ambulatory Visit (INDEPENDENT_AMBULATORY_CARE_PROVIDER_SITE_OTHER): Payer: BLUE CROSS/BLUE SHIELD | Admitting: Family Medicine

## 2017-08-29 VITALS — BP 138/80 | HR 71 | Temp 98.1°F | Ht 70.5 in | Wt 226.0 lb

## 2017-08-29 DIAGNOSIS — Z0000 Encounter for general adult medical examination without abnormal findings: Secondary | ICD-10-CM | POA: Diagnosis not present

## 2017-08-29 DIAGNOSIS — E890 Postprocedural hypothyroidism: Secondary | ICD-10-CM | POA: Diagnosis not present

## 2017-08-29 MED ORDER — TERBINAFINE HCL 250 MG PO TABS: 250.0000 mg | ORAL_TABLET | Freq: Every day | ORAL | 1 refills | Status: DC

## 2017-08-29 MED ORDER — OMEPRAZOLE 20 MG PO CPDR: 20.0000 mg | DELAYED_RELEASE_CAPSULE | Freq: Every day | ORAL | 3 refills | Status: DC

## 2017-08-29 MED ORDER — LEVOTHYROXINE SODIUM 50 MCG PO TABS: ORAL_TABLET | ORAL | 3 refills | Status: DC

## 2017-08-29 NOTE — Progress Notes (Signed)
   Subjective:    Patient ID: Patrick [name], male    DOB: 06/08/1960, 57 y.o.   MRN: [id number]  HPI Here for a well exam. He feels great. He does want to talk about toenail fungus. He used Terbinafine some years ago for 90 day and had partial results. He continues to see Dr. Evelene CroonKaur for depression and ADHD.    Review of Systems  Constitutional: Negative.   HENT: Negative.   Eyes: Negative.   Respiratory: Negative.   Cardiovascular: Negative.   Gastrointestinal: Negative.   Genitourinary: Negative.   Musculoskeletal: Negative.   Skin: Negative.   Neurological: Negative.   Psychiatric/Behavioral: Negative.        Objective:   Physical Exam  Constitutional: He is oriented to person, place, and time. He appears well-developed and well-nourished. No distress.  HENT:  Head: Normocephalic and atraumatic.  Right Ear: External ear normal.  Left Ear: External ear normal.  Nose: Nose normal.  Mouth/Throat: Oropharynx is clear and moist. No oropharyngeal exudate.  Eyes: Pupils are equal, round, and reactive to light. Conjunctivae and EOM are normal. Right eye exhibits no discharge. Left eye exhibits no discharge. No scleral icterus.  Neck: Neck supple. No JVD present. No tracheal deviation present. No thyromegaly present.  Cardiovascular: Normal rate, regular rhythm, normal heart sounds and intact distal pulses. Exam reveals no gallop and no friction rub.  No murmur heard. Pulmonary/Chest: Effort normal and breath sounds normal. No respiratory distress. He has no wheezes. He has no rales. He exhibits no tenderness.  Abdominal: Soft. Bowel sounds are normal. He exhibits no distension and no mass. There is no tenderness. There is no rebound and no guarding.  Genitourinary: Rectum normal, prostate normal and penis normal. Rectal exam shows guaiac negative stool. No penile tenderness.  Musculoskeletal: Normal range of motion. He exhibits no edema or tenderness.  Lymphadenopathy:    He has  no cervical adenopathy.  Neurological: He is alert and oriented to person, place, and time. He has normal reflexes. He displays normal reflexes. No cranial nerve deficit. He exhibits normal muscle tone. Coordination normal.  Skin: Skin is warm and dry. No rash noted. He is not diaphoretic. No erythema. No pallor.  The 1st and 4th toenails on the left foot show fungal involvement   Psychiatric: He has a normal mood and affect. His behavior is normal. Judgment and thought content normal.          Assessment & Plan:  Well exam. We discussed diet and exercise. Get fasting labs. Treat the onychomycosis with Terbinafine for 6 months. Gershon CraneStephen Fry, MD

## 2017-08-30 LAB — CBC WITH DIFFERENTIAL/PLATELET
Basophils Absolute: 0.1 10*3/uL (ref 0.0–0.1)
Basophils Relative: 0.9 % (ref 0.0–3.0)
Eosinophils Absolute: 0.3 10*3/uL (ref 0.0–0.7)
Eosinophils Relative: 4.5 % (ref 0.0–5.0)
HCT: 44.5 % (ref 39.0–52.0)
Hemoglobin: 15.5 g/dL (ref 13.0–17.0)
Lymphocytes Relative: 23.5 % (ref 12.0–46.0)
Lymphs Abs: 1.6 10*3/uL (ref 0.7–4.0)
MCHC: 34.8 g/dL (ref 30.0–36.0)
MCV: 90.3 fl (ref 78.0–100.0)
Monocytes Absolute: 0.9 10*3/uL (ref 0.1–1.0)
Monocytes Relative: 13.1 % — ABNORMAL HIGH (ref 3.0–12.0)
Neutro Abs: 3.9 10*3/uL (ref 1.4–7.7)
Neutrophils Relative %: 58 % (ref 43.0–77.0)
Platelets: 187 10*3/uL (ref 150.0–400.0)
RBC: 4.93 Mil/uL (ref 4.22–5.81)
RDW: 13.4 % (ref 11.5–15.5)
WBC: 6.7 10*3/uL (ref 4.0–10.5)

## 2017-08-30 LAB — LIPID PANEL
Cholesterol: 166 mg/dL (ref 0–200)
HDL: 39.3 mg/dL (ref >39.00)
LDL Cholesterol: 97 mg/dL (ref 0–99)
NonHDL: 126.31
Total CHOL/HDL Ratio: 4
Triglycerides: 148 mg/dL (ref 0.0–149.0)
VLDL: 29.6 mg/dL (ref 0.0–40.0)

## 2017-08-30 LAB — T4, FREE: Free T4: 0.93 ng/dL (ref 0.60–1.60)

## 2017-08-30 LAB — POC URINALSYSI DIPSTICK (AUTOMATED)
Bilirubin, UA: NEGATIVE
Blood, UA: NEGATIVE
Glucose, UA: NEGATIVE
Ketones, UA: NEGATIVE
Leukocytes, UA: NEGATIVE
Nitrite, UA: NEGATIVE
Protein, UA: NEGATIVE
Spec Grav, UA: 1.025
Urobilinogen, UA: 1 U/dL
pH, UA: 6

## 2017-08-30 LAB — BASIC METABOLIC PANEL WITH GFR
BUN: 22 mg/dL (ref 6–23)
CO2: 33 meq/L — ABNORMAL HIGH (ref 19–32)
Calcium: 9 mg/dL (ref 8.4–10.5)
Chloride: 104 meq/L (ref 96–112)
Creatinine, Ser: 1.37 mg/dL (ref 0.40–1.50)
GFR: 57.26 mL/min — ABNORMAL LOW
Glucose, Bld: 106 mg/dL — ABNORMAL HIGH (ref 70–99)
Potassium: 3.6 meq/L (ref 3.5–5.1)
Sodium: 143 meq/L (ref 135–145)

## 2017-08-30 LAB — TSH: TSH: 1.63 u[IU]/mL (ref 0.35–4.50)

## 2017-08-30 LAB — HEPATIC FUNCTION PANEL
ALT: 12 U/L (ref 0–53)
AST: 17 U/L (ref 0–37)
Albumin: 4 g/dL (ref 3.5–5.2)
Alkaline Phosphatase: 70 U/L (ref 39–117)
Bilirubin, Direct: 0.1 mg/dL (ref 0.0–0.3)
Total Bilirubin: 0.6 mg/dL (ref 0.2–1.2)
Total Protein: 6.3 g/dL (ref 6.0–8.3)

## 2017-08-30 LAB — PSA: PSA: 0.68 ng/mL (ref 0.10–4.00)

## 2017-08-30 LAB — T3, FREE: T3, Free: 3.7 pg/mL (ref 2.3–4.2)

## 2017-08-30 NOTE — Addendum Note (Signed)
Addended by: Laure KidneyLARK, Precilla Purnell J on: 08/30/2017 07:59 AM   Modules accepted: Orders

## 2017-09-12 ENCOUNTER — Other Ambulatory Visit: Payer: Self-pay | Admitting: Family Medicine

## 2017-10-16 ENCOUNTER — Other Ambulatory Visit: Payer: Self-pay | Admitting: Family Medicine

## 2017-10-31 DIAGNOSIS — F3342 Major depressive disorder, recurrent, in full remission: Secondary | ICD-10-CM | POA: Diagnosis not present

## 2017-10-31 DIAGNOSIS — F900 Attention-deficit hyperactivity disorder, predominantly inattentive type: Secondary | ICD-10-CM | POA: Diagnosis not present

## 2017-12-16 ENCOUNTER — Other Ambulatory Visit: Payer: Self-pay | Admitting: Family Medicine

## 2018-02-07 ENCOUNTER — Other Ambulatory Visit: Payer: Self-pay | Admitting: Family Medicine

## 2018-02-08 NOTE — Telephone Encounter (Signed)
Dr Fry please advise. thanks 

## 2018-02-14 ENCOUNTER — Encounter: Payer: Self-pay | Admitting: Family Medicine

## 2018-02-14 ENCOUNTER — Ambulatory Visit (INDEPENDENT_AMBULATORY_CARE_PROVIDER_SITE_OTHER): Payer: BLUE CROSS/BLUE SHIELD | Admitting: Family Medicine

## 2018-02-14 VITALS — BP 118/84 | HR 61 | Temp 98.5°F | Wt 222.1 lb

## 2018-02-14 DIAGNOSIS — M1000 Idiopathic gout, unspecified site: Secondary | ICD-10-CM | POA: Diagnosis not present

## 2018-02-14 MED ORDER — METHYLPREDNISOLONE 4 MG PO TBPK: ORAL_TABLET | ORAL | 0 refills | Status: DC

## 2018-02-14 NOTE — Progress Notes (Signed)
   Subjective:    Patient ID: Patrick [name], male    DOB: 1960-04-24, 58 y.o.   MRN: [id number]  HPI Here for 3 days of pain and swelling in the left ankle with no recent trauma. Naproxen does not help.    Review of Systems  Constitutional: Negative.   Respiratory: Negative.   Cardiovascular: Negative.   Musculoskeletal: Positive for arthralgias and joint swelling.       Objective:   Physical Exam Constitutional:      Appearance: Normal appearance.  Cardiovascular:     Rate and Rhythm: Normal rate and regular rhythm.     Pulses: Normal pulses.     Heart sounds: Normal heart sounds.  Pulmonary:     Effort: Pulmonary effort is normal.     Breath sounds: Normal breath sounds.  Musculoskeletal:     Comments: Left lateral ankle is swollen, warm, and tender. No erythema   Neurological:     Mental Status: He is alert.           Assessment & Plan:  Gout, treat with a steroid dose pack.  Gershon Crane, MD

## 2018-04-09 ENCOUNTER — Other Ambulatory Visit: Payer: Self-pay | Admitting: Family Medicine

## 2018-04-24 DIAGNOSIS — F339 Major depressive disorder, recurrent, unspecified: Secondary | ICD-10-CM | POA: Diagnosis not present

## 2018-07-31 ENCOUNTER — Other Ambulatory Visit: Payer: Self-pay | Admitting: Family Medicine

## 2018-08-04 ENCOUNTER — Other Ambulatory Visit: Payer: Self-pay | Admitting: Family Medicine

## 2018-08-06 ENCOUNTER — Other Ambulatory Visit: Payer: Self-pay | Admitting: Family Medicine

## 2018-09-04 ENCOUNTER — Other Ambulatory Visit: Payer: Self-pay | Admitting: Family Medicine

## 2018-09-05 ENCOUNTER — Other Ambulatory Visit: Payer: Self-pay | Admitting: Family Medicine

## 2018-10-05 ENCOUNTER — Other Ambulatory Visit: Payer: Self-pay | Admitting: Family Medicine

## 2018-10-09 ENCOUNTER — Encounter: Payer: BLUE CROSS/BLUE SHIELD | Admitting: Family Medicine

## 2018-10-16 DIAGNOSIS — F3342 Major depressive disorder, recurrent, in full remission: Secondary | ICD-10-CM | POA: Diagnosis not present

## 2018-10-16 DIAGNOSIS — F900 Attention-deficit hyperactivity disorder, predominantly inattentive type: Secondary | ICD-10-CM | POA: Diagnosis not present

## 2018-10-17 ENCOUNTER — Encounter: Payer: Self-pay | Admitting: Family Medicine

## 2018-10-17 ENCOUNTER — Other Ambulatory Visit: Payer: Self-pay

## 2018-10-17 ENCOUNTER — Ambulatory Visit (INDEPENDENT_AMBULATORY_CARE_PROVIDER_SITE_OTHER): Payer: BC Managed Care – PPO | Admitting: Family Medicine

## 2018-10-17 VITALS — BP 110/80 | HR 65 | Temp 98.3°F | Ht 69.75 in | Wt 212.0 lb

## 2018-10-17 DIAGNOSIS — N138 Other obstructive and reflux uropathy: Secondary | ICD-10-CM | POA: Diagnosis not present

## 2018-10-17 DIAGNOSIS — N401 Enlarged prostate with lower urinary tract symptoms: Secondary | ICD-10-CM

## 2018-10-17 DIAGNOSIS — Z23 Encounter for immunization: Secondary | ICD-10-CM

## 2018-10-17 DIAGNOSIS — M1000 Idiopathic gout, unspecified site: Secondary | ICD-10-CM

## 2018-10-17 DIAGNOSIS — Z0000 Encounter for general adult medical examination without abnormal findings: Secondary | ICD-10-CM

## 2018-10-17 NOTE — Progress Notes (Signed)
Subjective:    Patient ID: Patrick [name], male    DOB: Jun 04, 1960, 58 y.o.   MRN: [id number]  HPI Here for a well exam. He feels well. He has not had a gout attack since last spring. He does mention getting up to urinate 4-5 times a night. No burning or discomfort. He still sees Dr. Toy Care for depression and ADHD, which are stable.    Review of Systems  Constitutional: Negative.   HENT: Negative.   Eyes: Negative.   Respiratory: Negative.   Cardiovascular: Negative.   Gastrointestinal: Negative.   Genitourinary: Positive for frequency.  Musculoskeletal: Negative.   Skin: Negative.   Neurological: Negative.   Psychiatric/Behavioral: Negative.        Objective:   Physical Exam Constitutional:      General: He is not in acute distress.    Appearance: He is well-developed. He is not diaphoretic.  HENT:     Head: Normocephalic and atraumatic.     Right Ear: External ear normal.     Left Ear: External ear normal.     Nose: Nose normal.     Mouth/Throat:     Pharynx: No oropharyngeal exudate.  Eyes:     General: No scleral icterus.       Right eye: No discharge.        Left eye: No discharge.     Conjunctiva/sclera: Conjunctivae normal.     Pupils: Pupils are equal, round, and reactive to light.  Neck:     Musculoskeletal: Neck supple.     Thyroid: No thyromegaly.     Vascular: No JVD.     Trachea: No tracheal deviation.  Cardiovascular:     Rate and Rhythm: Normal rate and regular rhythm.     Heart sounds: Normal heart sounds. No murmur. No friction rub. No gallop.   Pulmonary:     Effort: Pulmonary effort is normal. No respiratory distress.     Breath sounds: Normal breath sounds. No wheezing or rales.  Chest:     Chest wall: No tenderness.  Abdominal:     General: Bowel sounds are normal. There is no distension.     Palpations: Abdomen is soft. There is no mass.     Tenderness: There is no abdominal tenderness. There is no guarding or rebound.   Genitourinary:    Penis: Normal. No tenderness.      Prostate: Normal.     Rectum: Normal. Guaiac result negative.  Musculoskeletal: Normal range of motion.        General: No tenderness.  Lymphadenopathy:     Cervical: No cervical adenopathy.  Skin:    General: Skin is warm and dry.     Coloration: Skin is not pale.     Findings: No erythema or rash.  Neurological:     Mental Status: He is alert and oriented to person, place, and time.     Cranial Nerves: No cranial nerve deficit.     Motor: No abnormal muscle tone.     Coordination: Coordination normal.     Deep Tendon Reflexes: Reflexes are normal and symmetric. Reflexes normal.  Psychiatric:        Behavior: Behavior normal.        Thought Content: Thought content normal.        Judgment: Judgment normal.           Assessment & Plan:  Well exam. We discussed diet and exercise. Set up fasting labs soon. For the BPH, he will  try saw palmetto OTC. Gershon Crane, MD

## 2018-10-18 ENCOUNTER — Other Ambulatory Visit: Payer: Self-pay

## 2018-10-18 ENCOUNTER — Other Ambulatory Visit (INDEPENDENT_AMBULATORY_CARE_PROVIDER_SITE_OTHER): Payer: BC Managed Care – PPO

## 2018-10-18 DIAGNOSIS — M1000 Idiopathic gout, unspecified site: Secondary | ICD-10-CM

## 2018-10-18 DIAGNOSIS — Z0000 Encounter for general adult medical examination without abnormal findings: Secondary | ICD-10-CM

## 2018-10-18 DIAGNOSIS — Z125 Encounter for screening for malignant neoplasm of prostate: Secondary | ICD-10-CM

## 2018-10-18 LAB — LIPID PANEL
Cholesterol: 192 mg/dL (ref 0–200)
HDL: 61.9 mg/dL (ref >39.00)
LDL Cholesterol: 119 mg/dL — ABNORMAL HIGH (ref 0–99)
NonHDL: 129.97
Total CHOL/HDL Ratio: 3
Triglycerides: 54 mg/dL (ref 0.0–149.0)
VLDL: 10.8 mg/dL (ref 0.0–40.0)

## 2018-10-18 LAB — PSA: PSA: 0.8 ng/mL (ref 0.10–4.00)

## 2018-10-18 LAB — POC URINALSYSI DIPSTICK (AUTOMATED)
Bilirubin, UA: NEGATIVE
Blood, UA: NEGATIVE
Glucose, UA: NEGATIVE
Ketones, UA: NEGATIVE
Leukocytes, UA: NEGATIVE
Nitrite, UA: NEGATIVE
Protein, UA: NEGATIVE
Spec Grav, UA: 1.015 (ref 1.010–1.025)
Urobilinogen, UA: 0.2 E.U./dL
pH, UA: 7 (ref 5.0–8.0)

## 2018-10-18 LAB — HEPATIC FUNCTION PANEL
ALT: 13 U/L (ref 0–53)
AST: 19 U/L (ref 0–37)
Albumin: 4.6 g/dL (ref 3.5–5.2)
Alkaline Phosphatase: 63 U/L (ref 39–117)
Bilirubin, Direct: 0.2 mg/dL (ref 0.0–0.3)
Total Bilirubin: 0.9 mg/dL (ref 0.2–1.2)
Total Protein: 6.9 g/dL (ref 6.0–8.3)

## 2018-10-18 LAB — CBC WITH DIFFERENTIAL/PLATELET
Basophils Absolute: 0 10*3/uL (ref 0.0–0.1)
Basophils Relative: 0.6 % (ref 0.0–3.0)
Eosinophils Absolute: 0.2 10*3/uL (ref 0.0–0.7)
Eosinophils Relative: 2.9 % (ref 0.0–5.0)
HCT: 48 % (ref 39.0–52.0)
Hemoglobin: 16.5 g/dL (ref 13.0–17.0)
Lymphocytes Relative: 20.1 % (ref 12.0–46.0)
Lymphs Abs: 1.1 10*3/uL (ref 0.7–4.0)
MCHC: 34.4 g/dL (ref 30.0–36.0)
MCV: 93.9 fl (ref 78.0–100.0)
Monocytes Absolute: 0.8 10*3/uL (ref 0.1–1.0)
Monocytes Relative: 14.2 % — ABNORMAL HIGH (ref 3.0–12.0)
Neutro Abs: 3.5 10*3/uL (ref 1.4–7.7)
Neutrophils Relative %: 62.2 % (ref 43.0–77.0)
Platelets: 183 10*3/uL (ref 150.0–400.0)
RBC: 5.12 Mil/uL (ref 4.22–5.81)
RDW: 13.6 % (ref 11.5–15.5)
WBC: 5.6 10*3/uL (ref 4.0–10.5)

## 2018-10-18 LAB — BASIC METABOLIC PANEL
BUN: 17 mg/dL (ref 6–23)
CO2: 31 mEq/L (ref 19–32)
Calcium: 9.5 mg/dL (ref 8.4–10.5)
Chloride: 100 mEq/L (ref 96–112)
Creatinine, Ser: 1.25 mg/dL (ref 0.40–1.50)
GFR: 59.64 mL/min — ABNORMAL LOW (ref >60.00)
Glucose, Bld: 106 mg/dL — ABNORMAL HIGH (ref 70–99)
Potassium: 3.7 mEq/L (ref 3.5–5.1)
Sodium: 140 mEq/L (ref 135–145)

## 2018-10-18 LAB — TSH: TSH: 1.09 u[IU]/mL (ref 0.35–4.50)

## 2018-10-18 LAB — URIC ACID: Uric Acid, Serum: 8 mg/dL — ABNORMAL HIGH (ref 4.0–7.8)

## 2018-10-19 MED ORDER — ALLOPURINOL 100 MG PO TABS: 100.0000 mg | ORAL_TABLET | Freq: Every day | ORAL | 3 refills | Status: DC

## 2018-10-21 ENCOUNTER — Other Ambulatory Visit: Payer: Self-pay | Admitting: Family Medicine

## 2018-11-23 ENCOUNTER — Other Ambulatory Visit: Payer: Self-pay | Admitting: Family Medicine

## 2019-01-03 ENCOUNTER — Other Ambulatory Visit: Payer: Self-pay | Admitting: Family Medicine

## 2019-01-05 ENCOUNTER — Other Ambulatory Visit: Payer: Self-pay | Admitting: Family Medicine

## 2019-02-11 ENCOUNTER — Other Ambulatory Visit: Payer: Self-pay | Admitting: Family Medicine

## 2019-03-23 ENCOUNTER — Other Ambulatory Visit: Payer: Self-pay | Admitting: Family Medicine

## 2019-03-25 ENCOUNTER — Other Ambulatory Visit: Payer: Self-pay | Admitting: Family Medicine

## 2019-04-10 DIAGNOSIS — F900 Attention-deficit hyperactivity disorder, predominantly inattentive type: Secondary | ICD-10-CM | POA: Diagnosis not present

## 2019-04-10 DIAGNOSIS — F3342 Major depressive disorder, recurrent, in full remission: Secondary | ICD-10-CM | POA: Diagnosis not present

## 2019-06-20 ENCOUNTER — Other Ambulatory Visit: Payer: Self-pay | Admitting: Family Medicine

## 2019-07-25 ENCOUNTER — Other Ambulatory Visit: Payer: Self-pay | Admitting: Family Medicine

## 2019-07-26 ENCOUNTER — Other Ambulatory Visit: Payer: Self-pay | Admitting: Family Medicine

## 2019-08-11 ENCOUNTER — Other Ambulatory Visit: Payer: Self-pay | Admitting: Family Medicine

## 2019-09-06 ENCOUNTER — Encounter: Payer: Self-pay | Admitting: Family Medicine

## 2019-09-06 ENCOUNTER — Ambulatory Visit (INDEPENDENT_AMBULATORY_CARE_PROVIDER_SITE_OTHER): Payer: BC Managed Care – PPO | Admitting: Family Medicine

## 2019-09-06 ENCOUNTER — Other Ambulatory Visit: Payer: Self-pay

## 2019-09-06 VITALS — BP 120/70 | HR 77 | Temp 98.5°F | Wt 190.2 lb

## 2019-09-06 DIAGNOSIS — S63622A Sprain of interphalangeal joint of left thumb, initial encounter: Secondary | ICD-10-CM | POA: Diagnosis not present

## 2019-09-06 NOTE — Progress Notes (Signed)
   Subjective:    Patient ID: Patrick [name], male    DOB: 06/16/1960, 59 y.o.   MRN: [id number]  HPI Here to check his left thumb. He fell at home 3 weeks when his foot slipped off a step, and he fell on his outstretched left hand, bending the thumb back. His pain has been improving ever since. It mostly bothers him when he grips. The thumb never swelled or turned blue.    Review of Systems  Constitutional: Negative.   Respiratory: Negative.   Cardiovascular: Negative.   Musculoskeletal: Positive for arthralgias.       Objective:   Physical Exam Constitutional:      Appearance: Normal appearance.  Cardiovascular:     Rate and Rhythm: Normal rate and regular rhythm.     Pulses: Normal pulses.     Heart sounds: Normal heart sounds.  Pulmonary:     Effort: Pulmonary effort is normal.     Breath sounds: Normal breath sounds.  Musculoskeletal:     Comments: Left thumb and hand appear normal. ROM is full. He is mildly tender over the PIP joint and there is mild pain when he grips   Neurological:     Mental Status: He is alert.           Assessment & Plan:  Thumb sprain, this should heal up fine in the next few weeks. He can squeeze a tennis ball to regain his grip strength. Recheck prn. Gershon Crane, MD

## 2019-09-14 ENCOUNTER — Other Ambulatory Visit: Payer: Self-pay | Admitting: Family Medicine

## 2019-09-20 DIAGNOSIS — Z20822 Contact with and (suspected) exposure to covid-19: Secondary | ICD-10-CM | POA: Diagnosis not present

## 2019-10-05 DIAGNOSIS — F3342 Major depressive disorder, recurrent, in full remission: Secondary | ICD-10-CM | POA: Diagnosis not present

## 2019-10-05 DIAGNOSIS — F900 Attention-deficit hyperactivity disorder, predominantly inattentive type: Secondary | ICD-10-CM | POA: Diagnosis not present

## 2019-10-16 ENCOUNTER — Other Ambulatory Visit: Payer: Self-pay | Admitting: Family Medicine

## 2019-10-18 ENCOUNTER — Encounter: Payer: Self-pay | Admitting: Family Medicine

## 2019-10-18 ENCOUNTER — Ambulatory Visit (INDEPENDENT_AMBULATORY_CARE_PROVIDER_SITE_OTHER): Payer: BC Managed Care – PPO | Admitting: Family Medicine

## 2019-10-18 ENCOUNTER — Other Ambulatory Visit: Payer: Self-pay

## 2019-10-18 VITALS — BP 130/90 | HR 73 | Temp 98.3°F | Ht 70.0 in | Wt 190.6 lb

## 2019-10-18 DIAGNOSIS — Z0000 Encounter for general adult medical examination without abnormal findings: Secondary | ICD-10-CM | POA: Diagnosis not present

## 2019-10-18 DIAGNOSIS — S63642D Sprain of metacarpophalangeal joint of left thumb, subsequent encounter: Secondary | ICD-10-CM | POA: Diagnosis not present

## 2019-10-18 DIAGNOSIS — E039 Hypothyroidism, unspecified: Secondary | ICD-10-CM | POA: Diagnosis not present

## 2019-10-18 DIAGNOSIS — Z23 Encounter for immunization: Secondary | ICD-10-CM

## 2019-10-18 DIAGNOSIS — I10 Essential (primary) hypertension: Secondary | ICD-10-CM | POA: Diagnosis not present

## 2019-10-18 MED ORDER — BUPROPION HCL ER (XL) 150 MG PO TB24
300.0000 mg | ORAL_TABLET | Freq: Every day | ORAL | 0 refills | Status: DC
Start: 2019-10-18 — End: 2020-12-26

## 2019-10-18 NOTE — Addendum Note (Signed)
Addended by: Wilford Corner on: 10/18/2019 02:10 PM   Modules accepted: Orders

## 2019-10-18 NOTE — Progress Notes (Signed)
Subjective:    Patient ID: Patrick [name], male    DOB: 05/14/1960, 59 y.o.   MRN: [id number]  HPI Here for a well exam. He feels well except for the left thumb. This was injured in early August, and it has not really improved since then. He still has pain whenever he applies any force to the thumb. He is due this year for another colonoscopy, and he is in the process of setting this up with Dr. Hulen Shouts office. He still sees Dr. Evelene Croon for depression and ADHD.    Review of Systems  Constitutional: Negative.   HENT: Negative.   Eyes: Negative.   Respiratory: Negative.   Cardiovascular: Negative.   Gastrointestinal: Negative.   Genitourinary: Negative.   Musculoskeletal: Positive for arthralgias.  Skin: Negative.   Neurological: Negative.   Psychiatric/Behavioral: Negative.        Objective:   Physical Exam Constitutional:      General: He is not in acute distress.    Appearance: He is well-developed. He is not diaphoretic.  HENT:     Head: Normocephalic and atraumatic.     Right Ear: External ear normal.     Left Ear: External ear normal.     Nose: Nose normal.     Mouth/Throat:     Pharynx: No oropharyngeal exudate.  Eyes:     General: No scleral icterus.       Right eye: No discharge.        Left eye: No discharge.     Conjunctiva/sclera: Conjunctivae normal.     Pupils: Pupils are equal, round, and reactive to light.  Neck:     Thyroid: No thyromegaly.     Vascular: No JVD.     Trachea: No tracheal deviation.  Cardiovascular:     Rate and Rhythm: Normal rate and regular rhythm.     Heart sounds: Normal heart sounds. No murmur heard.  No friction rub. No gallop.   Pulmonary:     Effort: Pulmonary effort is normal. No respiratory distress.     Breath sounds: Normal breath sounds. No wheezing or rales.  Chest:     Chest wall: No tenderness.  Abdominal:     General: Bowel sounds are normal. There is no distension.     Palpations: Abdomen is soft. There is no  mass.     Tenderness: There is no abdominal tenderness. There is no guarding or rebound.  Genitourinary:    Penis: Normal. No tenderness.      Testes: Normal.     Prostate: Normal.     Rectum: Normal. Guaiac result negative.  Musculoskeletal:        General: No tenderness. Normal range of motion.     Cervical back: Neck supple.  Lymphadenopathy:     Cervical: No cervical adenopathy.  Skin:    General: Skin is warm and dry.     Coloration: Skin is not pale.     Findings: No erythema or rash.  Neurological:     Mental Status: He is alert and oriented to person, place, and time.     Cranial Nerves: No cranial nerve deficit.     Motor: No abnormal muscle tone.     Coordination: Coordination normal.     [name] Tendon Reflexes: Reflexes are normal and symmetric. Reflexes normal.  Psychiatric:        Behavior: Behavior normal.        Thought Content: Thought content normal.  Judgment: Judgment normal.           Assessment & Plan:  Well exam. We discussed diet and exercise. Get fasting labs. We will refer him to Hand Surgery for the thumb sprain. Gershon Crane, MD

## 2019-10-19 LAB — HEPATIC FUNCTION PANEL
AG Ratio: 2.1 (calc) (ref 1.0–2.5)
ALT: 7 U/L — ABNORMAL LOW (ref 9–46)
AST: 14 U/L (ref 10–35)
Albumin: 4.5 g/dL (ref 3.6–5.1)
Alkaline phosphatase (APISO): 63 U/L (ref 35–144)
Bilirubin, Direct: 0.1 mg/dL (ref 0.0–0.2)
Globulin: 2.1 g/dL (calc) (ref 1.9–3.7)
Indirect Bilirubin: 0.4 mg/dL (calc) (ref 0.2–1.2)
Total Bilirubin: 0.5 mg/dL (ref 0.2–1.2)
Total Protein: 6.6 g/dL (ref 6.1–8.1)

## 2019-10-19 LAB — CBC WITH DIFFERENTIAL/PLATELET
Absolute Monocytes: 676 cells/uL (ref 200–950)
Basophils Absolute: 32 cells/uL (ref 0–200)
Basophils Relative: 0.7 %
Eosinophils Absolute: 193 cells/uL (ref 15–500)
Eosinophils Relative: 4.2 %
HCT: 46.9 % (ref 38.5–50.0)
Hemoglobin: 16.1 g/dL (ref 13.2–17.1)
Lymphs Abs: 1109 cells/uL (ref 850–3900)
MCH: 31.9 pg (ref 27.0–33.0)
MCHC: 34.3 g/dL (ref 32.0–36.0)
MCV: 93.1 fL (ref 80.0–100.0)
MPV: 12.1 fL (ref 7.5–12.5)
Monocytes Relative: 14.7 %
Neutro Abs: 2590 cells/uL (ref 1500–7800)
Neutrophils Relative %: 56.3 %
Platelets: 200 10*3/uL (ref 140–400)
RBC: 5.04 10*6/uL (ref 4.20–5.80)
RDW: 12.3 % (ref 11.0–15.0)
Total Lymphocyte: 24.1 %
WBC: 4.6 10*3/uL (ref 3.8–10.8)

## 2019-10-19 LAB — BASIC METABOLIC PANEL
BUN: 18 mg/dL (ref 7–25)
CO2: 30 mmol/L (ref 20–32)
Calcium: 9.4 mg/dL (ref 8.6–10.3)
Chloride: 102 mmol/L (ref 98–110)
Creat: 1.17 mg/dL (ref 0.70–1.33)
Glucose, Bld: 101 mg/dL — ABNORMAL HIGH (ref 65–99)
Potassium: 4 mmol/L (ref 3.5–5.3)
Sodium: 141 mmol/L (ref 135–146)

## 2019-10-19 LAB — PSA: PSA: 0.5 ng/mL (ref <=4.0)

## 2019-10-19 LAB — LIPID PANEL
Cholesterol: 188 mg/dL (ref <200)
HDL: 64 mg/dL (ref >=40)
LDL Cholesterol (Calc): 110 mg/dL (calc) — ABNORMAL HIGH
Non-HDL Cholesterol (Calc): 124 mg/dL (calc) (ref <130)
Total CHOL/HDL Ratio: 2.9 (calc) (ref <5.0)
Triglycerides: 46 mg/dL (ref <150)

## 2019-10-19 LAB — TSH: TSH: 0.82 mIU/L (ref 0.40–4.50)

## 2019-10-22 ENCOUNTER — Ambulatory Visit: Payer: BC Managed Care – PPO | Admitting: Physician Assistant

## 2019-10-23 ENCOUNTER — Other Ambulatory Visit: Payer: Self-pay

## 2019-10-23 ENCOUNTER — Ambulatory Visit (INDEPENDENT_AMBULATORY_CARE_PROVIDER_SITE_OTHER): Payer: BC Managed Care – PPO | Admitting: Family Medicine

## 2019-10-23 ENCOUNTER — Encounter: Payer: Self-pay | Admitting: Family Medicine

## 2019-10-23 ENCOUNTER — Ambulatory Visit: Payer: Self-pay

## 2019-10-23 DIAGNOSIS — M79645 Pain in left finger(s): Secondary | ICD-10-CM | POA: Diagnosis not present

## 2019-10-23 NOTE — Progress Notes (Signed)
Office Visit Note   Patient: Patrick [name]           Date of Birth: [birth year]           MRN: [id number] Visit Date: 10/23/2019 Requested by: Nelwyn Salisbury, MD [id number] [place],  [place] [address] PCP: Nelwyn Salisbury, MD  Subjective: Chief Complaint  Patient presents with   Left Thumb - Pain    Fell backward, catching self with his left hand. Resistance in the thumb with certain movement. Right-hand dominant.    HPI: He is here with left thumb pain.  In August he fell backward and caught himself with his left hand causing his thumb to bend backward.  He had pain but it was not severe.  However, it has continued to bother him especially when trying to grab something wide.  He is not dropping things, denies any numbness or tingling.  No previous problems with this thumb.  He is right-hand dominant.  He is using occasional Voltaren gel but nothing on a regular basis.  He also purchased a splint and is wearing that sometimes.                ROS:   All other systems were reviewed and are negative.  Objective: Vital Signs: There were no vitals taken for this visit.  Physical Exam:  General:  Alert and oriented, in no acute distress. Pulm:  Breathing unlabored. Psy:  Normal mood, congruent affect. Skin: No bruising or erythema Left hand: There is slight swelling of the MCP joint.  He has intact flexor and extensor mechanisms.  There is no significant laxity with stress of the ulnar collateral ligament and radial collateral ligament of the MCP joint.  No triggering at the A1 pulley.   Imaging: US Guided Needle Placement - No Linked Charges  Result Date: 10/23/2019 Limited diagnostic ultrasound of the left thumb reveals a small partial tear of the deeper fibers of the ulnar collateral ligament but the overall fibers are intact.  There is no significant laxity with stress of the UCL under ultrasound.  No significant joint effusion, no hyperemia on power Doppler imaging.   Flexor and extensor tendons are intact.  No visible fracture.   Assessment & Plan: 1.  Left thumb MCP sprain with small partial tear of ulnar collateral ligament -Discussed options with him including nitroglycerin patch therapy, occupational therapy referral, dextrose prolotherapy.  He would like to try Voltaren gel more regularly at home, splint, grip strength exercises after couple weeks of resting it.  If pain persists, he will contact me and let me know.     Procedures: No procedures performed  No notes on file     PMFS History: Patient Active Problem List   Diagnosis Date Noted   BPH with urinary obstruction 10/17/2018   Gout 08/11/2016   Depression with anxiety 08/11/2016   HTN (hypertension) 08/11/2016   ADHD (attention deficit hyperactivity disorder), inattentive type 08/11/2016   Hypothyroidism 08/11/2016   Environmental allergies 08/11/2016   GERD (gastroesophageal reflux disease) 08/11/2016   Overweight (BMI 25.0-29.9) 05/03/2012   Past Medical History:  Diagnosis Date   ADHD (attention deficit hyperactivity disorder), inattentive type    sees Dr. Evelene Croon    Anxiety    sees Dr. Evelene Croon    Arthritis    DDD (degenerative disc disease), lumbar    Depression    sees Dr. Evelene Croon    Environmental allergies    GERD (gastroesophageal reflux disease)  History of blood transfusion    Hypertension    Hypothyroidism    partial thyroidectomy for benign nodule     History reviewed. No pertinent family history.  Past Surgical History:  Procedure Laterality Date   BACK SURGERY Right 1985   microdiscectomy   COLONOSCOPY  2016   per Dr. Dulce Sellar, an unknown type of polyp was removed. repeat in 5 yrs    THYROIDECTOMY, PARTIAL  1998   TOTAL HIP ARTHROPLASTY Left 05/02/2012   Procedure: LEFT TOTAL HIP ARTHROPLASTY ANTERIOR APPROACH;  Surgeon: Shelda Pal, MD;  Location: [facility];  Service: Orthopedics;  Laterality: Left;   Social History   Occupational  History   Not on file  Tobacco Use   Smoking status: Former Smoker    Types: Cigarettes    Quit date: 04/27/2002    Years since quitting: 17.5   Smokeless tobacco: Former Neurosurgeon    Quit date: 04/27/2002  Substance and Sexual Activity   Alcohol use: Yes    Comment: 3-4 glasses wine/beer  week   Drug use: No   Sexual activity: Not on file

## 2019-10-23 NOTE — Progress Notes (Signed)
ult

## 2019-11-01 ENCOUNTER — Other Ambulatory Visit: Payer: Self-pay | Admitting: Family Medicine

## 2019-12-10 ENCOUNTER — Other Ambulatory Visit: Payer: Self-pay | Admitting: Family Medicine

## 2019-12-24 ENCOUNTER — Encounter: Payer: Self-pay | Admitting: Family Medicine

## 2019-12-24 DIAGNOSIS — M79645 Pain in left finger(s): Secondary | ICD-10-CM

## 2019-12-27 ENCOUNTER — Other Ambulatory Visit: Payer: Self-pay | Admitting: Family Medicine

## 2020-01-29 ENCOUNTER — Ambulatory Visit
Admission: RE | Admit: 2020-01-29 | Discharge: 2020-01-29 | Disposition: A | Discharge status: Home / Self Care | Payer: BC Managed Care – PPO | Source: Ambulatory Visit | Attending: Family Medicine | Admitting: Family Medicine

## 2020-01-29 DIAGNOSIS — M79645 Pain in left finger(s): Secondary | ICD-10-CM

## 2020-01-29 DIAGNOSIS — G8929 Other chronic pain: Secondary | ICD-10-CM | POA: Diagnosis not present

## 2020-01-29 MED ORDER — IOPAMIDOL (ISOVUE-M 200) INJECTION 41%
3.0000 mL | Freq: Once | INTRAMUSCULAR | Status: AC
  Administered 2020-01-29: 3 mL via INTRA_ARTICULAR

## 2020-01-30 ENCOUNTER — Telehealth: Payer: Self-pay | Admitting: Family Medicine

## 2020-01-30 NOTE — Telephone Encounter (Signed)
MRI looks good overall, no ligament tears that would require surgery.  There's some swelling in the metacarpal bone of the thumb still, and this could be the reason for the pain.  Referral to hand therapist might help eliminate the pain.  If interested, let me know and I'll make referral.

## 2020-02-14 DIAGNOSIS — Z01812 Encounter for preprocedural laboratory examination: Secondary | ICD-10-CM | POA: Diagnosis not present

## 2020-02-19 ENCOUNTER — Telehealth: Payer: Self-pay

## 2020-02-19 DIAGNOSIS — D122 Benign neoplasm of ascending colon: Secondary | ICD-10-CM | POA: Diagnosis not present

## 2020-02-19 DIAGNOSIS — Z86010 Personal history of colonic polyps: Secondary | ICD-10-CM | POA: Diagnosis not present

## 2020-02-19 DIAGNOSIS — D120 Benign neoplasm of cecum: Secondary | ICD-10-CM | POA: Diagnosis not present

## 2020-02-19 DIAGNOSIS — D123 Benign neoplasm of transverse colon: Secondary | ICD-10-CM | POA: Diagnosis not present

## 2020-02-19 HISTORY — PX: COLONOSCOPY: SHX174

## 2020-02-19 NOTE — Telephone Encounter (Signed)
Clydie Braun with [employer] would like to have patient's demographics faxed to [phone].  CB# [phone].  Please advise.  Thank you.

## 2020-02-19 NOTE — Telephone Encounter (Signed)
Faxed

## 2020-02-25 DIAGNOSIS — W19XXXS Unspecified fall, sequela: Secondary | ICD-10-CM | POA: Diagnosis not present

## 2020-02-25 DIAGNOSIS — S63602S Unspecified sprain of left thumb, sequela: Secondary | ICD-10-CM | POA: Diagnosis not present

## 2020-02-25 DIAGNOSIS — M6281 Muscle weakness (generalized): Secondary | ICD-10-CM | POA: Diagnosis not present

## 2020-02-25 DIAGNOSIS — M25542 Pain in joints of left hand: Secondary | ICD-10-CM | POA: Diagnosis not present

## 2020-03-03 DIAGNOSIS — S63602S Unspecified sprain of left thumb, sequela: Secondary | ICD-10-CM | POA: Diagnosis not present

## 2020-03-03 DIAGNOSIS — M25542 Pain in joints of left hand: Secondary | ICD-10-CM | POA: Diagnosis not present

## 2020-03-03 DIAGNOSIS — M6281 Muscle weakness (generalized): Secondary | ICD-10-CM | POA: Diagnosis not present

## 2020-03-03 DIAGNOSIS — W19XXXS Unspecified fall, sequela: Secondary | ICD-10-CM | POA: Diagnosis not present

## 2020-03-05 ENCOUNTER — Other Ambulatory Visit: Payer: Self-pay | Admitting: Family Medicine

## 2020-03-05 NOTE — Telephone Encounter (Signed)
Propranol ER is not available on formulary.  Please choose an alternative.

## 2020-03-09 ENCOUNTER — Other Ambulatory Visit: Payer: Self-pay | Admitting: Family Medicine

## 2020-03-10 ENCOUNTER — Other Ambulatory Visit: Payer: Self-pay

## 2020-03-10 MED ORDER — AMLODIPINE BESYLATE 5 MG PO TABS: 5.0000 mg | ORAL_TABLET | Freq: Two times a day (BID) | ORAL | 0 refills | Status: DC

## 2020-03-10 MED ORDER — OMEPRAZOLE 20 MG PO CPDR: 20.0000 mg | DELAYED_RELEASE_CAPSULE | Freq: Every day | ORAL | 0 refills | Status: DC

## 2020-03-10 MED ORDER — ALLOPURINOL 100 MG PO TABS: ORAL_TABLET | ORAL | 0 refills | Status: DC

## 2020-03-10 NOTE — Telephone Encounter (Signed)
Request for Losartan 100mg , but medication is no longer on formulary.

## 2020-03-10 NOTE — Progress Notes (Unsigned)
Request also for Losartan 100mg , but this medication is no longer on formulary.

## 2020-03-11 DIAGNOSIS — M6281 Muscle weakness (generalized): Secondary | ICD-10-CM | POA: Diagnosis not present

## 2020-03-11 DIAGNOSIS — W19XXXS Unspecified fall, sequela: Secondary | ICD-10-CM | POA: Diagnosis not present

## 2020-03-11 DIAGNOSIS — M25542 Pain in joints of left hand: Secondary | ICD-10-CM | POA: Diagnosis not present

## 2020-03-11 DIAGNOSIS — S63602S Unspecified sprain of left thumb, sequela: Secondary | ICD-10-CM | POA: Diagnosis not present

## 2020-03-11 MED ORDER — TERBINAFINE HCL 250 MG PO TABS: 250.0000 mg | ORAL_TABLET | Freq: Every day | ORAL | 1 refills | Status: DC

## 2020-03-28 DIAGNOSIS — F3342 Major depressive disorder, recurrent, in full remission: Secondary | ICD-10-CM | POA: Diagnosis not present

## 2020-03-28 DIAGNOSIS — F900 Attention-deficit hyperactivity disorder, predominantly inattentive type: Secondary | ICD-10-CM | POA: Diagnosis not present

## 2020-06-07 ENCOUNTER — Other Ambulatory Visit: Payer: Self-pay | Admitting: Family Medicine

## 2020-08-20 ENCOUNTER — Encounter: Payer: Self-pay | Admitting: Family Medicine

## 2020-08-20 ENCOUNTER — Ambulatory Visit (INDEPENDENT_AMBULATORY_CARE_PROVIDER_SITE_OTHER): Payer: BC Managed Care – PPO | Admitting: Family Medicine

## 2020-08-20 ENCOUNTER — Other Ambulatory Visit: Payer: Self-pay

## 2020-08-20 VITALS — BP 128/84 | HR 63 | Temp 98.4°F | Wt 196.1 lb

## 2020-08-20 DIAGNOSIS — M25551 Pain in right hip: Secondary | ICD-10-CM

## 2020-08-20 DIAGNOSIS — G8929 Other chronic pain: Secondary | ICD-10-CM | POA: Diagnosis not present

## 2020-08-20 MED ORDER — AMLODIPINE BESYLATE 5 MG PO TABS: ORAL_TABLET | ORAL | 3 refills | Status: DC

## 2020-08-20 MED ORDER — HYDROCHLOROTHIAZIDE 25 MG PO TABS: 25.0000 mg | ORAL_TABLET | Freq: Every day | ORAL | 3 refills | Status: DC

## 2020-08-20 MED ORDER — LOSARTAN POTASSIUM 100 MG PO TABS: 100.0000 mg | ORAL_TABLET | Freq: Every day | ORAL | 0 refills | Status: DC

## 2020-08-20 MED ORDER — LOSARTAN POTASSIUM 100 MG PO TABS: 100.0000 mg | ORAL_TABLET | Freq: Every day | ORAL | 3 refills | Status: DC

## 2020-08-20 MED ORDER — HYDROCHLOROTHIAZIDE 25 MG PO TABS: 25.0000 mg | ORAL_TABLET | Freq: Every day | ORAL | 0 refills | Status: DC

## 2020-08-20 MED ORDER — ALLOPURINOL 100 MG PO TABS: ORAL_TABLET | ORAL | 3 refills | Status: DC

## 2020-08-20 NOTE — Progress Notes (Signed)
   Subjective:    Patient ID: Patrick [name], male    DOB: 31-Jan-1960, 60 y.o.   MRN: [id number]  HPI Here for right hip pain that started several years ago but which is getting worse. The pain is in the anterior region and it radiates to the right groin. He had a left hip arthroplasty in 2014, and at that time he had early degenerative changes in the left hip as well. He has pain on standing, walking, or sitting. Taking Naproxen helps a little.    Review of Systems  Constitutional: Negative.   Respiratory: Negative.    Cardiovascular: Negative.   Musculoskeletal:  Positive for arthralgias.      Objective:   Physical Exam Constitutional:      Appearance: Normal appearance.  Cardiovascular:     Rate and Rhythm: Normal rate and regular rhythm.     Pulses: Normal pulses.     Heart sounds: Normal heart sounds.  Pulmonary:     Effort: Pulmonary effort is normal.     Breath sounds: Normal breath sounds.  Musculoskeletal:     Comments: Right hip exam is unremarkable   Neurological:     Mental Status: He is alert.          Assessment & Plan:  Right hip pain, we will refer him to see Dr. Charlann Boxer for further evaluation.  Gershon Crane, MD

## 2020-09-05 ENCOUNTER — Other Ambulatory Visit: Payer: Self-pay | Admitting: Family Medicine

## 2020-09-05 ENCOUNTER — Other Ambulatory Visit: Payer: Self-pay

## 2020-09-05 MED ORDER — OMEPRAZOLE 20 MG PO CPDR: DELAYED_RELEASE_CAPSULE | ORAL | 0 refills | Status: DC

## 2020-09-25 DIAGNOSIS — M25551 Pain in right hip: Secondary | ICD-10-CM | POA: Diagnosis not present

## 2020-10-01 DIAGNOSIS — F900 Attention-deficit hyperactivity disorder, predominantly inattentive type: Secondary | ICD-10-CM | POA: Diagnosis not present

## 2020-10-01 DIAGNOSIS — F3342 Major depressive disorder, recurrent, in full remission: Secondary | ICD-10-CM | POA: Diagnosis not present

## 2020-10-29 DIAGNOSIS — M1611 Unilateral primary osteoarthritis, right hip: Secondary | ICD-10-CM | POA: Diagnosis not present

## 2020-12-04 ENCOUNTER — Other Ambulatory Visit: Payer: Self-pay | Admitting: Family Medicine

## 2020-12-26 ENCOUNTER — Ambulatory Visit (INDEPENDENT_AMBULATORY_CARE_PROVIDER_SITE_OTHER): Payer: BC Managed Care – PPO | Admitting: Family Medicine

## 2020-12-26 ENCOUNTER — Encounter: Payer: Self-pay | Admitting: Family Medicine

## 2020-12-26 VITALS — BP 126/82 | HR 86 | Temp 98.7°F | Ht 70.0 in | Wt 214.0 lb

## 2020-12-26 DIAGNOSIS — E890 Postprocedural hypothyroidism: Secondary | ICD-10-CM | POA: Diagnosis not present

## 2020-12-26 DIAGNOSIS — M1000 Idiopathic gout, unspecified site: Secondary | ICD-10-CM | POA: Diagnosis not present

## 2020-12-26 DIAGNOSIS — Z0000 Encounter for general adult medical examination without abnormal findings: Secondary | ICD-10-CM

## 2020-12-26 DIAGNOSIS — Z23 Encounter for immunization: Secondary | ICD-10-CM | POA: Diagnosis not present

## 2020-12-26 DIAGNOSIS — Z01818 Encounter for other preprocedural examination: Secondary | ICD-10-CM

## 2020-12-26 LAB — BASIC METABOLIC PANEL
BUN: 23 mg/dL (ref 6–23)
CO2: 29 mEq/L (ref 19–32)
Calcium: 8.9 mg/dL (ref 8.4–10.5)
Chloride: 103 mEq/L (ref 96–112)
Creatinine, Ser: 1.26 mg/dL (ref 0.40–1.50)
GFR: 62.79 mL/min (ref >60.00)
Glucose, Bld: 95 mg/dL (ref 70–99)
Potassium: 3.8 mEq/L (ref 3.5–5.1)
Sodium: 139 mEq/L (ref 135–145)

## 2020-12-26 LAB — PSA: PSA: 0.82 ng/mL (ref 0.10–4.00)

## 2020-12-26 LAB — CBC WITH DIFFERENTIAL/PLATELET
Basophils Absolute: 0 10*3/uL (ref 0.0–0.1)
Basophils Relative: 0.8 % (ref 0.0–3.0)
Eosinophils Absolute: 0.3 10*3/uL (ref 0.0–0.7)
Eosinophils Relative: 5.9 % — ABNORMAL HIGH (ref 0.0–5.0)
HCT: 45.7 % (ref 39.0–52.0)
Hemoglobin: 15.6 g/dL (ref 13.0–17.0)
Lymphocytes Relative: 28 % (ref 12.0–46.0)
Lymphs Abs: 1.4 10*3/uL (ref 0.7–4.0)
MCHC: 34.1 g/dL (ref 30.0–36.0)
MCV: 94.3 fl (ref 78.0–100.0)
Monocytes Absolute: 0.6 10*3/uL (ref 0.1–1.0)
Monocytes Relative: 12.9 % — ABNORMAL HIGH (ref 3.0–12.0)
Neutro Abs: 2.6 10*3/uL (ref 1.4–7.7)
Neutrophils Relative %: 52.4 % (ref 43.0–77.0)
Platelets: 175 10*3/uL (ref 150.0–400.0)
RBC: 4.85 Mil/uL (ref 4.22–5.81)
RDW: 12.8 % (ref 11.5–15.5)
WBC: 4.9 10*3/uL (ref 4.0–10.5)

## 2020-12-26 LAB — T3, FREE: T3, Free: 4 pg/mL (ref 2.3–4.2)

## 2020-12-26 LAB — HEPATIC FUNCTION PANEL
ALT: 23 U/L (ref 0–53)
AST: 32 U/L (ref 0–37)
Albumin: 4.2 g/dL (ref 3.5–5.2)
Alkaline Phosphatase: 57 U/L (ref 39–117)
Bilirubin, Direct: 0.1 mg/dL (ref 0.0–0.3)
Total Bilirubin: 0.6 mg/dL (ref 0.2–1.2)
Total Protein: 6.3 g/dL (ref 6.0–8.3)

## 2020-12-26 LAB — PROTIME-INR
INR: 1 ratio (ref 0.8–1.0)
Prothrombin Time: 10.9 s (ref 9.6–13.1)

## 2020-12-26 LAB — LIPID PANEL
Cholesterol: 188 mg/dL (ref 0–200)
HDL: 52.2 mg/dL (ref >39.00)
LDL Cholesterol: 112 mg/dL — ABNORMAL HIGH (ref 0–99)
NonHDL: 135.85
Total CHOL/HDL Ratio: 4
Triglycerides: 119 mg/dL (ref 0.0–149.0)
VLDL: 23.8 mg/dL (ref 0.0–40.0)

## 2020-12-26 LAB — HEMOGLOBIN A1C: Hgb A1c MFr Bld: 5.3 % (ref 4.6–6.5)

## 2020-12-26 LAB — TSH: TSH: 2.47 u[IU]/mL (ref 0.35–5.50)

## 2020-12-26 LAB — T4, FREE: Free T4: 0.77 ng/dL (ref 0.60–1.60)

## 2020-12-26 LAB — URIC ACID: Uric Acid, Serum: 6.1 mg/dL (ref 4.0–7.8)

## 2020-12-26 MED ORDER — DICLOFENAC SODIUM 75 MG PO TBEC: 75.0000 mg | DELAYED_RELEASE_TABLET | Freq: Two times a day (BID) | ORAL | 2 refills | Status: DC

## 2020-12-26 MED ORDER — LEVOTHYROXINE SODIUM 50 MCG PO TABS: 50.0000 ug | ORAL_TABLET | Freq: Every day | ORAL | 3 refills | Status: DC

## 2020-12-26 NOTE — Progress Notes (Signed)
   Subjective:    Patient ID: Patrick [name], male    DOB: 05-13-1960, 60 y.o.   MRN: [id number]  HPI Here for a well exam. He feels  fine except for right hip pain. He is actually scheduled for a right hip total arthroplasty per Dr. Charlann Boxer on 01-08-21. Otherwise no complaints.    Review of Systems  Constitutional: Negative.   HENT: Negative.    Eyes: Negative.   Respiratory: Negative.    Cardiovascular: Negative.   Gastrointestinal: Negative.   Genitourinary: Negative.   Musculoskeletal:  Positive for arthralgias.  Skin: Negative.   Neurological: Negative.   Psychiatric/Behavioral: Negative.        Objective:   Physical Exam Constitutional:      General: He is not in acute distress.    Appearance: Normal appearance. He is well-developed. He is not diaphoretic.  HENT:     Head: Normocephalic and atraumatic.     Right Ear: External ear normal.     Left Ear: External ear normal.     Nose: Nose normal.     Mouth/Throat:     Pharynx: No oropharyngeal exudate.  Eyes:     General: No scleral icterus.       Right eye: No discharge.        Left eye: No discharge.     Conjunctiva/sclera: Conjunctivae normal.     Pupils: Pupils are equal, round, and reactive to light.  Neck:     Thyroid: No thyromegaly.     Vascular: No JVD.     Trachea: No tracheal deviation.  Cardiovascular:     Rate and Rhythm: Normal rate and regular rhythm.     Heart sounds: Normal heart sounds. No murmur heard.   No friction rub. No gallop.  Pulmonary:     Effort: Pulmonary effort is normal. No respiratory distress.     Breath sounds: Normal breath sounds. No wheezing or rales.  Chest:     Chest wall: No tenderness.  Abdominal:     General: Bowel sounds are normal. There is no distension.     Palpations: Abdomen is soft. There is no mass.     Tenderness: There is no abdominal tenderness. There is no guarding or rebound.  Genitourinary:    Penis: Normal. No tenderness.      Testes: Normal.      Prostate: Normal.     Rectum: Normal. Guaiac result negative.  Musculoskeletal:        General: No tenderness. Normal range of motion.     Cervical back: Neck supple.  Lymphadenopathy:     Cervical: No cervical adenopathy.  Skin:    General: Skin is warm and dry.     Coloration: Skin is not pale.     Findings: No erythema or rash.  Neurological:     Mental Status: He is alert and oriented to person, place, and time.     Cranial Nerves: No cranial nerve deficit.     Motor: No abnormal muscle tone.     Coordination: Coordination normal.     [name] Tendon Reflexes: Reflexes are normal and symmetric. Reflexes normal.  Psychiatric:        Behavior: Behavior normal.        Thought Content: Thought content normal.        Judgment: Judgment normal.          Assessment & Plan:  Well exam. We discussed diet and exercise. Get fasting labs.  Gershon Crane, MD

## 2020-12-26 NOTE — Addendum Note (Signed)
Addended by: Carola Rhine on: 12/26/2020 08:33 AM   Modules accepted: Orders

## 2021-01-08 DIAGNOSIS — M1611 Unilateral primary osteoarthritis, right hip: Secondary | ICD-10-CM | POA: Diagnosis not present

## 2021-03-08 ENCOUNTER — Other Ambulatory Visit: Payer: Self-pay | Admitting: Family Medicine

## 2021-03-25 DIAGNOSIS — F900 Attention-deficit hyperactivity disorder, predominantly inattentive type: Secondary | ICD-10-CM | POA: Diagnosis not present

## 2021-03-25 DIAGNOSIS — F3342 Major depressive disorder, recurrent, in full remission: Secondary | ICD-10-CM | POA: Diagnosis not present

## 2021-06-06 ENCOUNTER — Other Ambulatory Visit: Payer: Self-pay | Admitting: Family Medicine

## 2021-07-15 ENCOUNTER — Other Ambulatory Visit: Payer: Self-pay | Admitting: Family Medicine

## 2021-07-30 DIAGNOSIS — F329 Major depressive disorder, single episode, unspecified: Secondary | ICD-10-CM | POA: Diagnosis not present

## 2021-08-05 DIAGNOSIS — F329 Major depressive disorder, single episode, unspecified: Secondary | ICD-10-CM | POA: Diagnosis not present

## 2021-08-12 DIAGNOSIS — F329 Major depressive disorder, single episode, unspecified: Secondary | ICD-10-CM | POA: Diagnosis not present

## 2021-08-19 DIAGNOSIS — F329 Major depressive disorder, single episode, unspecified: Secondary | ICD-10-CM | POA: Diagnosis not present

## 2021-08-26 ENCOUNTER — Other Ambulatory Visit: Payer: Self-pay | Admitting: Family Medicine

## 2021-08-26 DIAGNOSIS — F329 Major depressive disorder, single episode, unspecified: Secondary | ICD-10-CM | POA: Diagnosis not present

## 2021-09-03 DIAGNOSIS — F329 Major depressive disorder, single episode, unspecified: Secondary | ICD-10-CM | POA: Diagnosis not present

## 2021-09-04 ENCOUNTER — Other Ambulatory Visit: Payer: Self-pay | Admitting: Family Medicine

## 2021-09-10 DIAGNOSIS — F329 Major depressive disorder, single episode, unspecified: Secondary | ICD-10-CM | POA: Diagnosis not present

## 2021-09-17 DIAGNOSIS — F329 Major depressive disorder, single episode, unspecified: Secondary | ICD-10-CM | POA: Diagnosis not present

## 2021-09-26 DIAGNOSIS — F329 Major depressive disorder, single episode, unspecified: Secondary | ICD-10-CM | POA: Diagnosis not present

## 2021-09-30 DIAGNOSIS — F900 Attention-deficit hyperactivity disorder, predominantly inattentive type: Secondary | ICD-10-CM | POA: Diagnosis not present

## 2021-09-30 DIAGNOSIS — F3342 Major depressive disorder, recurrent, in full remission: Secondary | ICD-10-CM | POA: Diagnosis not present

## 2021-10-02 ENCOUNTER — Telehealth: Payer: Self-pay | Admitting: Licensed Clinical Social Worker

## 2021-10-02 DIAGNOSIS — F329 Major depressive disorder, single episode, unspecified: Secondary | ICD-10-CM | POA: Diagnosis not present

## 2021-10-02 NOTE — Patient Outreach (Signed)
  Care Coordination   10/02/2021 Name: Patrick [name] MRN: [id number] DOB: 04/18/1960   Care Coordination Outreach Attempts:  An unsuccessful telephone outreach was attempted today to offer the patient information about available care coordination services as a benefit of their health plan.   Follow Up Plan:  Additional outreach attempts will be made to offer the patient care coordination information and services.   Encounter Outcome:  No Answer  Care Coordination Interventions Activated:  No   Care Coordination Interventions:  No, not indicated    Jenel Lucks, MSW, LCSW [facility] Care Management [facility]  [facility] [email or web address] Phone [phone] 2:13 PM

## 2021-10-09 DIAGNOSIS — F329 Major depressive disorder, single episode, unspecified: Secondary | ICD-10-CM | POA: Diagnosis not present

## 2021-10-16 DIAGNOSIS — F329 Major depressive disorder, single episode, unspecified: Secondary | ICD-10-CM | POA: Diagnosis not present

## 2021-10-23 DIAGNOSIS — F329 Major depressive disorder, single episode, unspecified: Secondary | ICD-10-CM | POA: Diagnosis not present

## 2021-10-30 DIAGNOSIS — F329 Major depressive disorder, single episode, unspecified: Secondary | ICD-10-CM | POA: Diagnosis not present

## 2021-11-06 DIAGNOSIS — F329 Major depressive disorder, single episode, unspecified: Secondary | ICD-10-CM | POA: Diagnosis not present

## 2021-11-13 DIAGNOSIS — F329 Major depressive disorder, single episode, unspecified: Secondary | ICD-10-CM | POA: Diagnosis not present

## 2021-11-20 DIAGNOSIS — F329 Major depressive disorder, single episode, unspecified: Secondary | ICD-10-CM | POA: Diagnosis not present

## 2021-11-27 DIAGNOSIS — F329 Major depressive disorder, single episode, unspecified: Secondary | ICD-10-CM | POA: Diagnosis not present

## 2021-12-04 DIAGNOSIS — F329 Major depressive disorder, single episode, unspecified: Secondary | ICD-10-CM | POA: Diagnosis not present

## 2021-12-09 ENCOUNTER — Other Ambulatory Visit: Payer: Self-pay | Admitting: Family Medicine

## 2021-12-10 ENCOUNTER — Other Ambulatory Visit: Payer: Self-pay | Admitting: Family Medicine

## 2021-12-11 DIAGNOSIS — F329 Major depressive disorder, single episode, unspecified: Secondary | ICD-10-CM | POA: Diagnosis not present

## 2021-12-18 DIAGNOSIS — F329 Major depressive disorder, single episode, unspecified: Secondary | ICD-10-CM | POA: Diagnosis not present

## 2021-12-25 DIAGNOSIS — F329 Major depressive disorder, single episode, unspecified: Secondary | ICD-10-CM | POA: Diagnosis not present

## 2022-01-01 DIAGNOSIS — F329 Major depressive disorder, single episode, unspecified: Secondary | ICD-10-CM | POA: Diagnosis not present

## 2022-01-02 ENCOUNTER — Encounter (HOSPITAL_COMMUNITY): Payer: Self-pay | Admitting: *Deleted

## 2022-01-02 ENCOUNTER — Emergency Department (HOSPITAL_COMMUNITY)
Admission: EM | Admit: 2022-01-02 | Discharge: 2022-01-02 | Disposition: A | Discharge status: Home / Self Care | Payer: BC Managed Care – PPO | Attending: Emergency Medicine | Admitting: Emergency Medicine

## 2022-01-02 ENCOUNTER — Other Ambulatory Visit: Payer: Self-pay

## 2022-01-02 DIAGNOSIS — I951 Orthostatic hypotension: Secondary | ICD-10-CM | POA: Diagnosis not present

## 2022-01-02 DIAGNOSIS — R42 Dizziness and giddiness: Secondary | ICD-10-CM | POA: Diagnosis not present

## 2022-01-02 DIAGNOSIS — Z79899 Other long term (current) drug therapy: Secondary | ICD-10-CM | POA: Insufficient documentation

## 2022-01-02 DIAGNOSIS — T50905A Adverse effect of unspecified drugs, medicaments and biological substances, initial encounter: Secondary | ICD-10-CM

## 2022-01-02 DIAGNOSIS — R55 Syncope and collapse: Secondary | ICD-10-CM | POA: Diagnosis not present

## 2022-01-02 DIAGNOSIS — W19XXXA Unspecified fall, initial encounter: Secondary | ICD-10-CM | POA: Diagnosis not present

## 2022-01-02 DIAGNOSIS — I959 Hypotension, unspecified: Secondary | ICD-10-CM | POA: Diagnosis not present

## 2022-01-02 DIAGNOSIS — T5191XA Toxic effect of unspecified alcohol, accidental (unintentional), initial encounter: Secondary | ICD-10-CM | POA: Diagnosis not present

## 2022-01-02 LAB — RAPID URINE DRUG SCREEN, HOSP PERFORMED
Amphetamines: POSITIVE — AB
Barbiturates: NOT DETECTED
Benzodiazepines: NOT DETECTED
Cocaine: NOT DETECTED
Opiates: NOT DETECTED
Tetrahydrocannabinol: NOT DETECTED

## 2022-01-02 LAB — CBC WITH DIFFERENTIAL/PLATELET
Abs Immature Granulocytes: 0.01 10*3/uL (ref 0.00–0.07)
Basophils Absolute: 0 10*3/uL (ref 0.0–0.1)
Basophils Relative: 1 %
Eosinophils Absolute: 0.3 10*3/uL (ref 0.0–0.5)
Eosinophils Relative: 4 %
HCT: 42.5 % (ref 39.0–52.0)
Hemoglobin: 14.6 g/dL (ref 13.0–17.0)
Immature Granulocytes: 0 %
Lymphocytes Relative: 17 %
Lymphs Abs: 1.3 10*3/uL (ref 0.7–4.0)
MCH: 30.6 pg (ref 26.0–34.0)
MCHC: 34.4 g/dL (ref 30.0–36.0)
MCV: 89.1 fL (ref 80.0–100.0)
Monocytes Absolute: 0.6 10*3/uL (ref 0.1–1.0)
Monocytes Relative: 9 %
Neutro Abs: 5.2 10*3/uL (ref 1.7–7.7)
Neutrophils Relative %: 69 %
Platelets: 174 10*3/uL (ref 150–400)
RBC: 4.77 MIL/uL (ref 4.22–5.81)
RDW: 12.7 % (ref 11.5–15.5)
WBC: 7.4 10*3/uL (ref 4.0–10.5)
nRBC: 0 % (ref 0.0–0.2)

## 2022-01-02 LAB — I-STAT CHEM 8, ED
BUN: 22 mg/dL — ABNORMAL HIGH (ref 6–20)
Calcium, Ion: 1.04 mmol/L — ABNORMAL LOW (ref 1.15–1.40)
Chloride: 103 mmol/L (ref 98–111)
Creatinine, Ser: 1.3 mg/dL — ABNORMAL HIGH (ref 0.61–1.24)
Glucose, Bld: 90 mg/dL (ref 70–99)
HCT: 42 % (ref 39.0–52.0)
Hemoglobin: 14.3 g/dL (ref 13.0–17.0)
Potassium: 3.2 mmol/L — ABNORMAL LOW (ref 3.5–5.1)
Sodium: 143 mmol/L (ref 135–145)
TCO2: 21 mmol/L — ABNORMAL LOW (ref 22–32)

## 2022-01-02 LAB — URINALYSIS, ROUTINE W REFLEX MICROSCOPIC
Bilirubin Urine: NEGATIVE
Glucose, UA: NEGATIVE mg/dL
Hgb urine dipstick: NEGATIVE
Ketones, ur: 5 mg/dL — AB
Leukocytes,Ua: NEGATIVE
Nitrite: NEGATIVE
Protein, ur: NEGATIVE mg/dL
Specific Gravity, Urine: 1.014 (ref 1.005–1.030)
pH: 6 (ref 5.0–8.0)

## 2022-01-02 MED ORDER — SODIUM CHLORIDE 0.9 % IV BOLUS: 500.0000 mL | Freq: Once | INTRAVENOUS | Status: DC

## 2022-01-02 MED ORDER — SODIUM CHLORIDE 0.9 % IV BOLUS
1000.0000 mL | Freq: Once | INTRAVENOUS | Status: AC
  Administered 2022-01-02: 1000 mL via INTRAVENOUS

## 2022-01-02 NOTE — ED Provider Notes (Signed)
[facility] EMERGENCY DEPARTMENT Provider Note   CSN: [id number] Arrival date & time: 01/02/22  3419     History  Chief Complaint  Patient presents with   Near Syncope    Per-Patrick [name] is a 61 y.o. male.  The history is provided by the patient.  Near Syncope This is a new problem. The problem occurs constantly. The problem has been rapidly improving. Pertinent negatives include no chest pain and no shortness of breath. Nothing aggravates the symptoms. Nothing relieves the symptoms. Treatments tried: IV fluids.  Patient with hypertension who has recently lost weight on a ketotic diet presents with near syncope and hypotension.  Patient took his blood pressure medication today and then this evening had several alcoholic beverages which is not customary for the patient.  No weakness nor numbness.  No chest pain or shortness of breath.  Given 500 cc of IV fluid by EMS with normalized BP.       Home Medications Prior to Admission medications   Medication Sig Start Date End Date Taking? Authorizing Provider  allopurinol (ZYLOPRIM) 100 MG tablet TAKE 1 TABLET BY MOUTH EVERY DAY 12/09/21   Nelwyn Salisbury, MD  amLODipine (NORVASC) 5 MG tablet TAKE 1 TABLET BY MOUTH TWICE DAILY 12/14/21   Nelwyn Salisbury, MD  amphetamine-dextroamphetamine (ADDERALL XR) 10 MG 24 hr capsule Take one every afternoon as needed 08/11/16   Nelwyn Salisbury, MD  amphetamine-dextroamphetamine (ADDERALL XR) 30 MG 24 hr capsule Take 1 capsule (30 mg total) by mouth every morning. 08/11/16   Nelwyn Salisbury, MD  diclofenac (VOLTAREN) 75 MG EC tablet Take 1 tablet (75 mg total) by mouth 2 (two) times daily. 12/26/20   Nelwyn Salisbury, MD  FLUoxetine (PROZAC) 40 MG capsule Take 40 mg by mouth daily.    [provider]  hydrochlorothiazide (HYDRODIURIL) 25 MG tablet TAKE 1 TABLET BY MOUTH DAILY 12/09/21   Nelwyn Salisbury, MD  levothyroxine (SYNTHROID) 50 MCG tablet TAKE 1 TABLET(50 MCG) BY MOUTH  DAILY BEFORE BREAKFAST 12/09/21   Nelwyn Salisbury, MD  loratadine (CLARITIN) 10 MG tablet Take 10 mg by mouth daily.    [provider]  losartan (COZAAR) 100 MG tablet TAKE 1 TABLET BY MOUTH EVERY DAY 12/09/21   Nelwyn Salisbury, MD  omeprazole (PRILOSEC) 20 MG capsule TAKE 1 CAPSULE BY MOUTH DAILY 03/09/21   Nelwyn Salisbury, MD  propranolol ER (INDERAL LA) 80 MG 24 hr capsule TAKE 1 CAPSULE BY MOUTH DAILY BEFORE BREAKFAST 12/14/21   Nelwyn Salisbury, MD      Allergies    Aspirin    Review of Systems   Review of Systems  Constitutional:  Negative for fever.  HENT:  Negative for facial swelling.   Eyes:  Negative for redness.  Respiratory:  Negative for shortness of breath, wheezing and stridor.   Cardiovascular:  Positive for near-syncope. Negative for chest pain.  Gastrointestinal:  Negative for diarrhea and vomiting.  Neurological:  Positive for light-headedness.  All other systems reviewed and are negative.   Physical Exam Updated Vital Signs BP 107/71   Pulse 66   Temp 97.6 F (36.4 C)   Resp 18   Ht 5\' 10"  (1.778 m)   Wt 97.1 kg   SpO2 100%   BMI 30.72 kg/m  Physical Exam Vitals and nursing note reviewed.  Constitutional:      General: He is not in acute distress.    Appearance: He is well-developed.  He is not diaphoretic.  HENT:     Head: Normocephalic and atraumatic.     Nose: Nose normal.  Eyes:     Conjunctiva/sclera: Conjunctivae normal.     Pupils: Pupils are equal, round, and reactive to light.  Cardiovascular:     Rate and Rhythm: Normal rate and regular rhythm.  Pulmonary:     Effort: Pulmonary effort is normal.     Breath sounds: Normal breath sounds. No wheezing or rales.  Abdominal:     General: Bowel sounds are normal.     Palpations: Abdomen is soft.     Tenderness: There is no abdominal tenderness. There is no guarding or rebound.  Musculoskeletal:        General: Normal range of motion.     Cervical back: Normal range of motion and neck  supple.  Skin:    General: Skin is warm and dry.     Capillary Refill: Capillary refill takes less than 2 seconds.  Neurological:     General: No focal deficit present.     Mental Status: He is alert and oriented to person, place, and time.     Deep Tendon Reflexes: Reflexes normal.  Psychiatric:        Mood and Affect: Mood normal.        Behavior: Behavior normal.     ED Results / Procedures / Treatments   Labs (all labs ordered are listed, but only abnormal results are displayed) Results for orders placed or performed during the hospital encounter of 01/02/22  CBC with Differential  Result Value Ref Range   WBC 7.4 4.0 - 10.5 K/uL   RBC 4.77 4.22 - 5.81 MIL/uL   Hemoglobin 14.6 13.0 - 17.0 g/dL   HCT 42.5 39.0 - 52.0 %   MCV 89.1 80.0 - 100.0 fL   MCH 30.6 26.0 - 34.0 pg   MCHC 34.4 30.0 - 36.0 g/dL   RDW 12.7 11.5 - 15.5 %   Platelets 174 150 - 400 K/uL   nRBC 0.0 0.0 - 0.2 %   Neutrophils Relative % 69 %   Neutro Abs 5.2 1.7 - 7.7 K/uL   Lymphocytes Relative 17 %   Lymphs Abs 1.3 0.7 - 4.0 K/uL   Monocytes Relative 9 %   Monocytes Absolute 0.6 0.1 - 1.0 K/uL   Eosinophils Relative 4 %   Eosinophils Absolute 0.3 0.0 - 0.5 K/uL   Basophils Relative 1 %   Basophils Absolute 0.0 0.0 - 0.1 K/uL   Immature Granulocytes 0 %   Abs Immature Granulocytes 0.01 0.00 - 0.07 K/uL  Urinalysis, Routine w reflex microscopic Urine, Clean Catch  Result Value Ref Range   Color, Urine YELLOW YELLOW   APPearance CLEAR CLEAR   Specific Gravity, Urine 1.014 1.005 - 1.030   pH 6.0 5.0 - 8.0   Glucose, UA NEGATIVE NEGATIVE mg/dL   Hgb urine dipstick NEGATIVE NEGATIVE   Bilirubin Urine NEGATIVE NEGATIVE   Ketones, ur 5 (A) NEGATIVE mg/dL   Protein, ur NEGATIVE NEGATIVE mg/dL   Nitrite NEGATIVE NEGATIVE   Leukocytes,Ua NEGATIVE NEGATIVE  Rapid urine drug screen (hospital performed)  Result Value Ref Range   Opiates NONE DETECTED NONE DETECTED   Cocaine NONE DETECTED NONE DETECTED    Benzodiazepines NONE DETECTED NONE DETECTED   Amphetamines POSITIVE (A) NONE DETECTED   Tetrahydrocannabinol NONE DETECTED NONE DETECTED   Barbiturates NONE DETECTED NONE DETECTED  I-stat chem 8, ED (not at [facility], [facility] or [facility])  Result Value  Ref Range   Sodium 143 135 - 145 mmol/L   Potassium 3.2 (L) 3.5 - 5.1 mmol/L   Chloride 103 98 - 111 mmol/L   BUN 22 (H) 6 - 20 mg/dL   Creatinine, Ser 1.30 (H) 0.61 - 1.24 mg/dL   Glucose, Bld 90 70 - 99 mg/dL   Calcium, Ion 1.04 (L) 1.15 - 1.40 mmol/L   TCO2 21 (L) 22 - 32 mmol/L   Hemoglobin 14.3 13.0 - 17.0 g/dL   HCT 42.0 39.0 - 52.0 %   No results found.  EKG  EKG Interpretation  Date/Time:  Saturday January 02 2022 06:31:44 EST Ventricular Rate:  73 PR Interval:  184 QRS Duration: 92 QT Interval:  436 QTC Calculation: 480 R Axis:   -18 Text Interpretation: Normal sinus rhythm Confirmed by Randal Buba, Elois Averitt ([id number]) on 01/02/2022 6:38:28 AM         Radiology No results found.  Procedures Procedures    Medications Ordered in ED Medications  sodium chloride 0.9 % bolus 1,000 mL (1,000 mLs Intravenous New Bag/Given 01/02/22 0434)    ED Course/ Medical Decision Making/ A&P                           Medical Decision Making Patient went to knees feeling lightheaded post BP medication and several alcoholic beverages this evening   Amount and/or Complexity of Data Reviewed Independent Historian: spouse and EMS    Details: See above  External Data Reviewed: notes.    Details: Previous notes reviewed  Labs: ordered.    Details: All labs: white count normal 7.4, normal hemoglobin 14.6, normal platelet count.  Urine is without UTI.  UDS with amphetamines (patient is on ADHD medication) normal sodium 143, potassium slightly low 3.2, creatinine slight elevation 1.3 ECG/medicine tests: ordered and independent interpretation performed. Decision-making details documented in ED Course.  Risk Risk Details: Patient who took his  medications and has lost weight and has continued to do keto diet.  Drank alcohol this evening for the first time in some time and then felt lightheaded.  The near syncopal event is do to a combination of the pressure medication with patient's new weight as well as a keto diet with the addition of ETOH.  Likely mild dehydration.  I have advised no further alcohol.  Drinking copious fluids and eating a well balanced diet.  Follow up with your PMD.  Strict return     Final Clinical Impression(s) / ED Diagnoses Final diagnoses:  None   Return for intractable cough, coughing up blood, fevers > 100.4 unrelieved by medication, shortness of breath, intractable vomiting, chest pain, shortness of breath, weakness, numbness, changes in speech, facial asymmetry, abdominal pain, passing out, Inability to tolerate liquids or food, cough, altered mental status or any concerns. No signs of systemic illness or infection. The patient is nontoxic-appearing on exam and vital signs are within normal limits.  I have reviewed the triage vital signs and the nursing notes. Pertinent labs & imaging results that were available during my care of the patient were reviewed by me and considered in my medical decision making (see chart for details). After history, exam, and medical workup I feel the patient has been appropriately medically screened and is safe for discharge home. Pertinent diagnoses were discussed with the patient. Patient was given return precautions.  Rx / DC Orders ED Discharge Orders     None         Melven Stockard,  Chisa Kushner, MD 01/02/22 [phone]

## 2022-01-02 NOTE — ED Notes (Signed)
Pt DC by previous RN.

## 2022-01-02 NOTE — ED Triage Notes (Signed)
The pt arrived by gems from home he almost faubted and fell to his knees when ems arrived his bp was in the 70s  ems gave him nss he is c/o back pain  some dizziness when they stood him up at the house  iv by gems

## 2022-01-06 ENCOUNTER — Telehealth: Payer: Self-pay | Admitting: *Deleted

## 2022-01-06 ENCOUNTER — Encounter: Payer: Self-pay | Admitting: Family Medicine

## 2022-01-06 ENCOUNTER — Ambulatory Visit (INDEPENDENT_AMBULATORY_CARE_PROVIDER_SITE_OTHER): Payer: BC Managed Care – PPO | Admitting: Family Medicine

## 2022-01-06 ENCOUNTER — Encounter: Payer: Self-pay | Admitting: *Deleted

## 2022-01-06 VITALS — BP 108/78 | HR 91 | Temp 97.8°F | Wt 199.2 lb

## 2022-01-06 DIAGNOSIS — I951 Orthostatic hypotension: Secondary | ICD-10-CM

## 2022-01-06 DIAGNOSIS — I10 Essential (primary) hypertension: Secondary | ICD-10-CM | POA: Diagnosis not present

## 2022-01-06 NOTE — Patient Instructions (Signed)
Visit Information  Thank you for taking time to visit with me today. Please don't hesitate to contact me if I can be of assistance to you.   Following are the goals we discussed today:   Goals Addressed             This Visit's Progress    COMPLETED: Care coordination activity       Care Coordination Interventions: Reviewed medications with patient and discussed adherence with no needed refills Reviewed scheduled/upcoming provider appointments including sufficient transportation source Screening for signs and symptoms of depression related to chronic disease state  Assessed social determinant of health barriers Educated on care and disease management services. Pt opt to declined with no needs today.          Please call the care guide team at [phone] if you need to cancel or reschedule your appointment.   If you are experiencing a Mental Health or Behavioral Health Crisis or need someone to talk to, please call the Suicide and Crisis Lifeline: 988 call the [place] National Suicide Prevention Lifeline: 640 752 4684 or TTY: 986-086-4883 TTY 463-085-1048) to talk to a trained counselor call 1-800-273-TALK (toll free, 24 hour hotline)  Patient verbalizes understanding of instructions and care plan provided today and agrees to view in MyChart. Active MyChart status and patient understanding of how to access instructions and care plan via MyChart confirmed with patient.     No further follow up required: No follow up needs  Elliot Cousin, RN Care Management Coordinator [facility] Main Office [phone]

## 2022-01-06 NOTE — Patient Outreach (Signed)
  Care Coordination   Initial Visit Note   01/06/2022 Name: BRAHIM DOLMAN MRN: [id number] DOB: September 27, 1960  [name] HAVOC SANLUIS is a 61 y.o. year old male who sees Nelwyn Salisbury, MD for primary care. I spoke with  [name] by phone today.  What matters to the patients health and wellness today?  No needs    Goals Addressed             This Visit's Progress    COMPLETED: Care coordination activity       Care Coordination Interventions: Reviewed medications with patient and discussed adherence with no needed refills Reviewed scheduled/upcoming provider appointments including sufficient transportation source Screening for signs and symptoms of depression related to chronic disease state  Assessed social determinant of health barriers Educated on care and disease management services. Pt opt to declined with no needs today.          SDOH assessments and interventions completed:  Yes  SDOH Interventions Today    Flowsheet Row Most Recent Value  SDOH Interventions   Food Insecurity Interventions Intervention Not Indicated  Housing Interventions Intervention Not Indicated  Transportation Interventions Intervention Not Indicated  Utilities Interventions Intervention Not Indicated        Care Coordination Interventions:  Yes, provided   Follow up plan: No further intervention required.   Encounter Outcome:  Pt. Visit Completed   Elliot Cousin, RN Care Management Coordinator [facility] Main Office [phone]

## 2022-01-06 NOTE — Progress Notes (Signed)
   Subjective:    Patient ID: Patrick [name], male    DOB: 1961/01/07, 61 y.o.   MRN: [id number]  HPI Here to follow up on an ED visit on 01-02-22 for near syncope. On arrival his BP was 107/71 with a pulse of 66. His exam was normal. Labs were significant only for a potaasiu of 3.2. Of note, he recently went on a keto diet, and he post 15 lbs in a 2 month period. It was felt he had orthostasis, and he was treated with IV fluids. Since then he has felt fine.    Review of Systems  Constitutional: Negative.   Respiratory: Negative.    Cardiovascular: Negative.   Gastrointestinal: Negative.   Genitourinary: Negative.   Neurological:  Positive for light-headedness. Negative for dizziness.       Objective:   Physical Exam Constitutional:      Appearance: Normal appearance. He is not ill-appearing.  Cardiovascular:     Rate and Rhythm: Normal rate and regular rhythm.     Pulses: Normal pulses.     Heart sounds: Normal heart sounds.  Pulmonary:     Effort: Pulmonary effort is normal.     Breath sounds: Normal breath sounds.  Neurological:     Mental Status: He is alert.           Assessment & Plan:  He seems to be hypotensive as a result of losing a fair amount of weight. We agreed to stop the HCTZ and to stop the Amlodipine. He will return here soon for a well exam, and we will reassess the BP at that time. We spent a total of ( 32  ) minutes reviewing records and discussing these issues.  Gershon Crane, MD

## 2022-01-08 DIAGNOSIS — F329 Major depressive disorder, single episode, unspecified: Secondary | ICD-10-CM | POA: Diagnosis not present

## 2022-01-12 DIAGNOSIS — F329 Major depressive disorder, single episode, unspecified: Secondary | ICD-10-CM | POA: Diagnosis not present

## 2022-01-28 ENCOUNTER — Ambulatory Visit: Payer: BC Managed Care – PPO | Admitting: Family Medicine

## 2022-02-19 ENCOUNTER — Ambulatory Visit (INDEPENDENT_AMBULATORY_CARE_PROVIDER_SITE_OTHER): Payer: BC Managed Care – PPO

## 2022-02-19 ENCOUNTER — Ambulatory Visit (HOSPITAL_BASED_OUTPATIENT_CLINIC_OR_DEPARTMENT_OTHER): Payer: 59 | Admitting: Orthopaedic Surgery

## 2022-02-19 DIAGNOSIS — M19012 Primary osteoarthritis, left shoulder: Secondary | ICD-10-CM | POA: Diagnosis not present

## 2022-02-19 DIAGNOSIS — M25512 Pain in left shoulder: Secondary | ICD-10-CM

## 2022-02-19 NOTE — Progress Notes (Signed)
Chief Complaint: Left shoulder pain     History of Present Illness:    Patrick [name] is a 62 y.o. male right-hand-dominant male presents with left shoulder pain after an injury on January 10, 2022 where he felt a pop in the shoulder.  Since that time the shoulder has felt unstable and feels like it is giving out with persistent pain.  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  He is trialed anti-inflammatories with limited relief.  He does enjoy being active in the spare time.  He is here today for further assessment.    Surgical History:   none  PMH/PSH/Family History/Social History/Meds/Allergies:    Past Medical History:  Diagnosis Date   ADHD (attention deficit hyperactivity disorder), inattentive type    sees Dr. Toy Care    Anxiety    sees Dr. Toy Care    Arthritis    DDD (degenerative disc disease), lumbar    Depression    sees Dr. Toy Care    Environmental allergies    GERD (gastroesophageal reflux disease)    History of blood transfusion    Hypertension    Hypothyroidism    partial thyroidectomy for benign nodule    Past Surgical History:  Procedure Laterality Date   BACK SURGERY Right 1985   microdiscectomy   COLONOSCOPY  2016   per Dr. Paulita Fujita, an unknown type of polyp was removed. repeat in 5 yrs    THYROIDECTOMY, PARTIAL  1998   TOTAL HIP ARTHROPLASTY Left 05/02/2012   Procedure: LEFT TOTAL HIP ARTHROPLASTY ANTERIOR APPROACH;  Surgeon: Mauri Pole, MD;  Location: [facility];  Service: Orthopedics;  Laterality: Left;   Social History   Socioeconomic History   Marital status: Married    Spouse name: Not on file   Number of children: Not on file   Years of education: Not on file   Highest education level: Not on file  Occupational History   Not on file  Tobacco Use   Smoking status: Former    Types: Cigarettes    Quit date: 04/27/2002    Years since quitting: 19.8   Smokeless tobacco: Former    Quit  date: 04/27/2002  Substance and Sexual Activity   Alcohol use: Yes    Comment: 3-4 glasses wine/beer  week   Drug use: No   Sexual activity: Not on file  Other Topics Concern   Not on file  Social History Narrative   Not on file   Social Determinants of Health   Financial Resource Strain: Not on file  Food Insecurity: No Food Insecurity (01/06/2022)   Hunger Vital Sign    Worried About Running Out of Food in the Last Year: Never true    Ran Out of Food in the Last Year: Never true  Transportation Needs: No Transportation Needs (01/06/2022)   PRAPARE - Hydrologist (Medical): No    Lack of Transportation (Non-Medical): No  Physical Activity: Not on file  Stress: Not on file  Social Connections: Not on file   No family history on file. Allergies  Allergen Reactions   Aspirin Hives   Current Outpatient Medications  Medication Sig Dispense Refill   allopurinol (ZYLOPRIM) 100 MG tablet TAKE 1 TABLET BY MOUTH EVERY DAY 90 tablet 0   amLODipine (  NORVASC) 5 MG tablet TAKE 1 TABLET BY MOUTH TWICE DAILY 180 tablet 0   amphetamine-dextroamphetamine (ADDERALL XR) 30 MG 24 hr capsule Take 1 capsule (30 mg total) by mouth every morning. 30 capsule 0   FLUoxetine (PROZAC) 40 MG capsule Take 40 mg by mouth daily.     hydrochlorothiazide (HYDRODIURIL) 25 MG tablet TAKE 1 TABLET BY MOUTH DAILY 90 tablet 0   levothyroxine (SYNTHROID) 50 MCG tablet TAKE 1 TABLET(50 MCG) BY MOUTH DAILY BEFORE BREAKFAST 90 tablet 0   loratadine (CLARITIN) 10 MG tablet Take 10 mg by mouth daily.     losartan (COZAAR) 100 MG tablet TAKE 1 TABLET BY MOUTH EVERY DAY 90 tablet 0   omeprazole (PRILOSEC) 20 MG capsule TAKE 1 CAPSULE BY MOUTH DAILY 90 capsule 3   propranolol ER (INDERAL LA) 80 MG 24 hr capsule TAKE 1 CAPSULE BY MOUTH DAILY BEFORE BREAKFAST 90 capsule 0   No current facility-administered medications for this visit.   DG Shoulder Left  Result Date: 02/19/2022 CLINICAL DATA:   Left shoulder pain when reaching under a cabinet and felt a pop. EXAM: LEFT SHOULDER - 2+ VIEW COMPARISON:  None Available. FINDINGS: Normal bone mineralization. Minimal glenohumeral joint space narrowing. Mild inferior glenoid degenerative osteophytosis. Normal alignment of the acromioclavicular joint without significant osteoarthritis. No acute fracture or dislocation. The visualized portion of the left lung is unremarkable. IMPRESSION: Mild glenohumeral osteoarthritis. Electronically Signed   By: Yvonne Kendall M.D.   On: 02/19/2022 09:28    Review of Systems:   A ROS was performed including pertinent positives and negatives as documented in the HPI.  Physical Exam :   Constitutional: NAD and appears stated age Neurological: Alert and oriented Psych: Appropriate affect and cooperative There were no vitals taken for this visit.   Comprehensive Musculoskeletal Exam:    Musculoskeletal Exam    Inspection Right Left  Skin No atrophy or winging No atrophy or winging  Palpation    Tenderness None Glenohumeral  Range of Motion    Flexion (passive) 170 170  Flexion (active) 170 170  Abduction 170 170  ER at the side 70 70  Can reach behind back to T12 T12  Strength     5/5 5/5, negative belly press  Special Tests    Pseudoparalytic No No  Neurologic    Fires PIN, radial, median, ulnar, musculocutaneous, axillary, suprascapular, long thoracic, and spinal accessory innervated muscles. No abnormal sensibility  Vascular/Lymphatic    Radial Pulse 2+ 2+  Cervical Exam    Patient has symmetric cervical range of motion with negative Spurling's test.  Special Test: Positive anterior apprehension, positive jerk     Imaging:   Xray (3 views left shoulder): Normal    I personally reviewed and interpreted the radiographs.   Assessment:   62 y.o. male right-hand-dominant male presents with left shoulder pain consistent with labral tearing.  At this time I would like to get him started  with a physical therapy program for strengthening program but given the fact that he did have an acute injury I do believe he would benefit from an MRI at this time so that we can rule out an underlying acute labral tear.  Plan to proceed with an MRI and I will see him back following discuss results  Plan :    -Bilateral shoulder and follow-up to discuss results     I personally saw and evaluated the patient, and participated in the management and treatment plan.  Vanetta Mulders,  MD Attending Physician, Orthopedic Surgery  This document was dictated using Dragon voice recognition software. A reasonable attempt at proof reading has been made to minimize errors.

## 2022-03-02 ENCOUNTER — Ambulatory Visit (INDEPENDENT_AMBULATORY_CARE_PROVIDER_SITE_OTHER): Payer: 59 | Admitting: Rehabilitative and Restorative Service Providers"

## 2022-03-02 ENCOUNTER — Encounter: Payer: Self-pay | Admitting: Rehabilitative and Restorative Service Providers"

## 2022-03-02 ENCOUNTER — Other Ambulatory Visit: Payer: Self-pay | Admitting: Family Medicine

## 2022-03-02 DIAGNOSIS — M25512 Pain in left shoulder: Secondary | ICD-10-CM | POA: Diagnosis not present

## 2022-03-02 DIAGNOSIS — M6281 Muscle weakness (generalized): Secondary | ICD-10-CM | POA: Diagnosis not present

## 2022-03-02 NOTE — Therapy (Signed)
OUTPATIENT PHYSICAL THERAPY SHOULDER EVALUATION   Patient Name: Patrick [name] MRN: [id number] DOB:1960-12-24, 62 y.o., male Today's Date: 03/02/2022  END OF SESSION:  PT End of Session - 03/02/22 1537     Visit Number 1    Number of Visits 8    Date for PT Re-Evaluation 04/27/22    Authorization Type AETNA    Authorization - Visit Number 60    Progress Note Due on Visit 8    PT Start Time [id number]    PT Stop Time 1531    PT Time Calculation (min) 53 min    Activity Tolerance Patient tolerated treatment well;Patient limited by pain;No increased pain    Behavior During Therapy WFL for tasks assessed/performed             Past Medical History:  Diagnosis Date   ADHD (attention deficit hyperactivity disorder), inattentive type    sees Dr. Toy Care    Anxiety    sees Dr. Toy Care    Arthritis    DDD (degenerative disc disease), lumbar    Depression    sees Dr. Toy Care    Environmental allergies    GERD (gastroesophageal reflux disease)    History of blood transfusion    Hypertension    Hypothyroidism    partial thyroidectomy for benign nodule    Past Surgical History:  Procedure Laterality Date   BACK SURGERY Right 1985   microdiscectomy   COLONOSCOPY  2016   per Dr. Paulita Fujita, an unknown type of polyp was removed. repeat in 5 yrs    THYROIDECTOMY, PARTIAL  1998   TOTAL HIP ARTHROPLASTY Left 05/02/2012   Procedure: LEFT TOTAL HIP ARTHROPLASTY ANTERIOR APPROACH;  Surgeon: Mauri Pole, MD;  Location: [facility];  Service: Orthopedics;  Laterality: Left;   Patient Active Problem List   Diagnosis Date Noted   Preoperative testing 12/26/2020   Chronic right hip pain 08/20/2020   BPH with urinary obstruction 10/17/2018   Gout 08/11/2016   Depression with anxiety 08/11/2016   HTN (hypertension) 08/11/2016   ADHD (attention deficit hyperactivity disorder), inattentive type 08/11/2016   Hypothyroidism 08/11/2016   Environmental allergies 08/11/2016   GERD (gastroesophageal reflux  disease) 08/11/2016   Overweight (BMI 25.0-29.9) 05/03/2012    PCP: Laurey Morale, MD  REFERRING PROVIDER: Vanetta Mulders, MD  REFERRING DIAG:  Diagnosis  M25.512 (ICD-10-CM) - Acute pain of left shoulder    THERAPY DIAG:  Acute pain of left shoulder  Muscle weakness (generalized)  Rationale for Evaluation and Treatment: Rehabilitation  ONSET DATE: 01/11/2023  SUBJECTIVE:  SUBJECTIVE STATEMENT: [name] (pronounced Per-Okie) was reaching for a baking sheet in an awkward position on Christmas Eve and felt a "pop" in his left shoulder.  It does not bother him sleeping, but is painful at the end of the day.  PERTINENT HISTORY: ADHD, OA, lumbar DDD, HTN, hypothyroid, previous microdiscectomy in 1985, Lt anterior THA in 2014  PAIN:  Are you having pain? Yes: NPRS scale: 2-6/10 Pain location: Left shoulder Pain description: Ache with stabbing in certain positions Aggravating factors: Dogs pull on leash, putting a shirt on, when elbow leaves the body Relieving factors: Prescription pain meds  PRECAUTIONS: None  WEIGHT BEARING RESTRICTIONS: No  FALLS:  Has patient fallen in last 6 months? 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  PATIENT SURVEYS:  FOTO 55 (Goal 67 in 11 visits)  COGNITION: Overall cognitive status: Within functional limits for tasks assessed     SENSATION: No complaints of peripheral pain, numbness or  paresthesias  POSTURE: Rounded and protracted shoulders noted  UPPER EXTREMITY ROM:   Passive ROM Left/Right 03/02/2022   Shoulder flexion 155/160   Shoulder extension    Shoulder abduction    Shoulder horizontal adduction 40/40   Shoulder internal rotation 25/60   Shoulder external rotation 95/95   Elbow flexion    Elbow extension    Wrist flexion    Wrist extension    Wrist ulnar deviation    Wrist radial deviation    Wrist pronation    Wrist supination    (Blank rows = not tested)  UPPER EXTREMITY STRENGTH:  Strength in pounds Left/Right 03/02/2022   Shoulder flexion    Shoulder extension    Shoulder abduction    Shoulder adduction    Shoulder internal rotation 30.6/40.0   Shoulder external rotation 4.2/28.2   Middle trapezius    Lower trapezius    Elbow flexion    Elbow extension    Wrist flexion    Wrist extension    Wrist ulnar deviation    Wrist radial deviation    Wrist pronation    Wrist supination    Grip strength (lbs)    (Blank rows = not tested)    TODAY'S TREATMENT:                                                                                                                                         DATE: 03/02/2022 Shoulder blade pinches 10 x 5 seconds Supine arm raises, scapular protraction 20 x 3 seconds with 3 pound weight Supine IR stretch 10 x 10 seconds, with PT assistance Side-lying shoulder  ER with towel roll under elbow 10 x 3 seconds  Functional Activities: Reviewed shoulder anatomy with shoulder model, discussed anatomy as related to impingement and possible rotator cuff tear.  Discussed conservative and potential surgical management if a rotator cuff tear is noted upon MRI in March.   PATIENT EDUCATION: Education details: See above Person educated: Patient Education method: Explanation, Demonstration, Tactile cues, Verbal cues, and Handouts Education comprehension: verbalized understanding, returned demonstration, verbal cues  required, tactile cues required, and needs further education  HOME EXERCISE PROGRAM: Access Code: [facility] URL: [email or web address] Date: 03/02/2022 Prepared by: Vista Mink  Exercises - Standing Scapular Retraction  - 5 x daily - 7 x weekly - 1 sets - 5 reps - 5 second hold - Supine Scapular Protraction in Flexion with Dumbbells  - 2 x daily - 7 x weekly - 1 sets - 20 reps - 3 seconds hold - Sidelying Shoulder External Rotation Dumbbell  - 2 x daily - 7 x weekly - 1 sets - 10 reps - Supine Shoulder Internal Rotation Stretch  - 2 x daily - 7 x weekly - 1 sets - 10 reps - 10 seconds hold  ASSESSMENT:  CLINICAL IMPRESSION: Patient is a 62 y.o. male who was seen today for physical therapy evaluation and treatment for left shoulder pain.  [name] had a tight left shoulder posterior capsule and very weak external rotation.  It would not be surprising if he has a rotator cuff tear, but we will address capsular tightness and strength impairments for the next 4 weeks with reassessment at that time to make additional recommendations.  With significant improvements in strength, pain and function, he may be ready for transfer into independent rehabilitation.  With minimal change in external rotation strength, he will be referred back to Dr. Sammuel Hines for his recommendations.  OBJECTIVE IMPAIRMENTS: decreased activity tolerance, decreased endurance, decreased knowledge of condition, decreased ROM, decreased strength, decreased safety awareness, impaired perceived functional ability, impaired UE functional use, postural dysfunction, and pain.   ACTIVITY LIMITATIONS: carrying, lifting, dressing, and reach over head  PARTICIPATION LIMITATIONS: community activity and occupation  PERSONAL FACTORS: ADHD, OA, lumbar DDD, HTN, hypothyroid, previous microdiscectomy in 1985, Lt anterior [place] in 2014 are also affecting patient's functional outcome.   REHAB POTENTIAL: Good  CLINICAL DECISION  MAKING: Stable/uncomplicated  EVALUATION COMPLEXITY: Low   GOALS: Goals reviewed with patient? Yes  SHORT TERM GOALS: Target date: 03/30/2022  Improve left shoulder IR AROM to at least 60 degrees Baseline: 25 degrees Goal status: INITIAL  2.  [name] will be independent with his day 1 home exercise program Baseline: Started 03/02/2022 Goal status: INITIAL  3.  Improve left shoulder ER strength to at least 10 pounds Baseline: 4.2 pounds Goal status: INITIAL   LONG TERM GOALS: Target date: 04/28/2022  Improve FOTO to 67 in 11 visits Baseline: 54 Goal status: INITIAL  2.  Improve left shoulder pain to consistently 0-3 out of 10 on the visual analog scale Baseline: 2-6 out of 10 Goal status: INITIAL  3.  Improve bilateral shoulder flexion AROM to 170 degrees Baseline: 155 to 160 degrees Goal status: INITIAL  4.  Improve left shoulder strength to at least 90% of the uninvolved right Baseline: 51% Goal status: INITIAL  5.  [name] will be independent with his long-term home exercise program at discharge Baseline: Started 03/02/2022 Goal status: INITIAL  PLAN:  PT FREQUENCY: 1x/week  PT DURATION: 8 weeks  PLANNED INTERVENTIONS: Therapeutic exercises, Therapeutic activity, Neuromuscular re-education, Patient/Family education, Self  Care, Joint mobilization, Dry Needling, Cryotherapy, and Manual therapy  PLAN FOR NEXT SESSION: Review day 1 HEP.  Focus on scapular and rotator cuff strengthening once IR AROM is normalized.   Farley Ly, PT, MPT 03/02/2022, 3:45 PM

## 2022-03-11 ENCOUNTER — Encounter: Payer: Self-pay | Admitting: Rehabilitative and Restorative Service Providers"

## 2022-03-11 ENCOUNTER — Ambulatory Visit (INDEPENDENT_AMBULATORY_CARE_PROVIDER_SITE_OTHER): Payer: 59 | Admitting: Rehabilitative and Restorative Service Providers"

## 2022-03-11 DIAGNOSIS — M6281 Muscle weakness (generalized): Secondary | ICD-10-CM | POA: Diagnosis not present

## 2022-03-11 DIAGNOSIS — M25512 Pain in left shoulder: Secondary | ICD-10-CM

## 2022-03-11 NOTE — Therapy (Signed)
OUTPATIENT PHYSICAL THERAPY TREATMENT NOTE   Patient Name: Patrick [name] MRN: [id number] DOB:23-Jul-1960, 62 y.o., male Today's Date: 03/11/2022  END OF SESSION:   PT End of Session - 03/11/22 0850     Visit Number 2    Number of Visits 8    Date for PT Re-Evaluation 04/27/22    Authorization Type AETNA    Authorization - Visit Number 60    Progress Note Due on Visit 8    PT Start Time 0803    PT Stop Time [id number]    PT Time Calculation (min) 41 min    Activity Tolerance Patient tolerated treatment well;No increased pain    Behavior During Therapy WFL for tasks assessed/performed             Past Medical History:  Diagnosis Date   ADHD (attention deficit hyperactivity disorder), inattentive type    sees Dr. Toy Care    Anxiety    sees Dr. Toy Care    Arthritis    DDD (degenerative disc disease), lumbar    Depression    sees Dr. Toy Care    Environmental allergies    GERD (gastroesophageal reflux disease)    History of blood transfusion    Hypertension    Hypothyroidism    partial thyroidectomy for benign nodule    Past Surgical History:  Procedure Laterality Date   BACK SURGERY Right 1985   microdiscectomy   COLONOSCOPY  2016   per Dr. Paulita Fujita, an unknown type of polyp was removed. repeat in 5 yrs    THYROIDECTOMY, PARTIAL  1998   TOTAL HIP ARTHROPLASTY Left 05/02/2012   Procedure: LEFT TOTAL HIP ARTHROPLASTY ANTERIOR APPROACH;  Surgeon: Mauri Pole, MD;  Location: [facility];  Service: Orthopedics;  Laterality: Left;   Patient Active Problem List   Diagnosis Date Noted   Preoperative testing 12/26/2020   Chronic right hip pain 08/20/2020   BPH with urinary obstruction 10/17/2018   Gout 08/11/2016   Depression with anxiety 08/11/2016   HTN (hypertension) 08/11/2016   ADHD (attention deficit hyperactivity disorder), inattentive type 08/11/2016   Hypothyroidism 08/11/2016   Environmental allergies 08/11/2016   GERD (gastroesophageal reflux disease) 08/11/2016    Overweight (BMI 25.0-29.9) 05/03/2012     THERAPY DIAG:  Acute pain of left shoulder  Muscle weakness (generalized)  PCP: Laurey Morale, MD   REFERRING PROVIDER: Vanetta Mulders, MD   REFERRING DIAG:  Diagnosis  M25.512 (ICD-10-CM) - Acute pain of left shoulder      THERAPY DIAG:  Acute pain of left shoulder   Muscle weakness (generalized)   Rationale for Evaluation and Treatment: Rehabilitation   ONSET DATE: 01/11/2023   SUBJECTIVE:  SUBJECTIVE STATEMENT: Patrick [name] (pronounced Per-Okie) is doing OK in the mornings.  Pain at night is common.  His original injury was reaching for a baking sheet in an awkward position on Christmas Eve and he felt a "pop" in his left shoulder.  It does not bother him sleeping, but is painful at the end of the day.   PERTINENT HISTORY: ADHD, OA, lumbar DDD, HTN, hypothyroid, previous microdiscectomy in 1985, Lt anterior THA in 2014   PAIN:  Are you having pain? Yes: NPRS scale: 3-7/10 Pain location: Left shoulder Pain description: Ache with stabbing in certain positions Aggravating factors: Dogs pull on leash, putting a shirt on, when elbow leaves the body Relieving factors: Prescription pain meds   PRECAUTIONS: None   WEIGHT BEARING RESTRICTIONS: No   FALLS:  Has patient fallen in last 6 months? 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   PATIENT SURVEYS:  FOTO 55 (Goal 67 in 11 visits)    COGNITION: Overall cognitive status: Within functional limits for tasks assessed                                  SENSATION: No complaints of peripheral pain, numbness or paresthesias   POSTURE: Rounded and protracted shoulders noted   UPPER EXTREMITY ROM:    Passive ROM Left/Right 03/02/2022 Left 03/11/2022   Shoulder flexion 155/160    Shoulder extension      Shoulder abduction      Shoulder horizontal adduction 40/40    Shoulder internal rotation 25/60  45  Shoulder external rotation 95/95    Elbow flexion      Elbow extension      Wrist flexion      Wrist extension      Wrist ulnar deviation      Wrist radial deviation      Wrist pronation      Wrist supination      (Blank rows = not tested)   UPPER EXTREMITY STRENGTH:   Strength in pounds Left/Right 03/02/2022    Shoulder flexion      Shoulder extension      Shoulder abduction      Shoulder adduction      Shoulder internal rotation 30.6/40.0    Shoulder external rotation 4.2/28.2    Middle trapezius      Lower trapezius      Elbow flexion      Elbow extension      Wrist flexion      Wrist extension      Wrist ulnar deviation      Wrist radial deviation      Wrist pronation      Wrist supination      Grip strength (lbs)      (Blank rows = not tested)               TODAY'S TREATMENT:                                  DATE:  03/11/2022 Shoulder blade pinches 10 x 5 seconds Supine arm raises, scapular protraction 20 x 3 seconds with 3 pound weight Supine IR stretch 10 x 10 seconds, with PT assistance Side-lying shoulder ER with towel roll under elbow 2 sets of 10 x 3 seconds 1# and 2# Theraband IR/ER Red and Green 10X each slow eccentrics Discussed using ice at the end of the day and a lumbar roll while on the computer for shoulders back posture   03/02/2022 Shoulder blade pinches 10 x 5 seconds Supine arm raises, scapular protraction 20 x 3 seconds with 3 pound weight Supine IR stretch 10 x 10 seconds,  with PT assistance Side-lying shoulder ER with towel roll under elbow 10 x 3 seconds   Functional Activities: Reviewed shoulder anatomy with shoulder model, discussed anatomy as related to impingement and possible rotator cuff tear.  Discussed conservative and potential surgical management if a rotator cuff tear is noted upon MRI in March.     PATIENT EDUCATION: Education details: See above Person educated: Patient Education method: Explanation, Demonstration, Tactile cues, Verbal cues, and Handouts Education comprehension: verbalized understanding, returned demonstration, verbal cues required, tactile cues required, and needs further education   HOME EXERCISE PROGRAM: Access Code: [facility] URL: [email or web address] Date: 03/11/2022 Prepared by: Vista Mink  Exercises - Standing Scapular Retraction  - 5 x daily - 7 x weekly - 1 sets - 5 reps - 5 second hold - Supine Scapular Protraction in Flexion with Dumbbells  - 2 x daily - 7 x weekly - 1 sets - 20 reps - 3 seconds hold - Sidelying Shoulder External Rotation Dumbbell  - 2 x daily - 7 x weekly - 1 sets - 10 reps - Supine Shoulder Internal Rotation Stretch  - 2 x daily - 7 x weekly - 1 sets - 10 reps - 10 seconds hold - Shoulder External Rotation with Anchored Resistance  - 1 x daily - 7 x weekly - 2 sets - 10 reps - 3 hold - Shoulder Internal Rotation with Resistance  - 1 x daily - 7 x weekly - 1 sets - 10 reps - 3 hold   ASSESSMENT:   CLINICAL IMPRESSION: Patrick [name] reports good early HEP compliance, although some correction was required.  We will continue to address capsular tightness and strength impairments for the next 2-3 weeks with reassessment at that time to make additional recommendations. With significant improvements in strength, pain and function, he may be ready for transfer into independent rehabilitation.  With minimal change in external rotation strength, he will be referred back to Dr. Sammuel Hines for his  recommendations.   OBJECTIVE IMPAIRMENTS: decreased activity tolerance, decreased endurance, decreased knowledge of condition, decreased ROM, decreased strength, decreased safety awareness, impaired perceived functional ability, impaired UE functional use, postural dysfunction, and pain.    ACTIVITY LIMITATIONS: carrying, lifting, dressing, and reach over head   PARTICIPATION LIMITATIONS: community activity and occupation   PERSONAL FACTORS: ADHD, OA, lumbar DDD, HTN, hypothyroid, previous microdiscectomy in 1985, Lt anterior [place] in 2014 are also affecting patient's functional outcome.    REHAB POTENTIAL: Good   CLINICAL DECISION MAKING: Stable/uncomplicated   EVALUATION COMPLEXITY: Low     GOALS: Goals reviewed with patient? Yes   SHORT TERM GOALS: Target date: 03/30/2022   Improve left shoulder IR AROM to at least 60 degrees Baseline: 25 degrees Goal status: On Going 03/11/2022   2.  Patrick [name] will be independent with his day 1 home exercise program Baseline: Started 03/02/2022 Goal status: On Going  03/11/2022   3.  Improve left shoulder ER strength to at least 10 pounds Baseline: 4.2 pounds Goal status: INITIAL     LONG TERM GOALS: Target date: 04/28/2022   Improve FOTO to 67 in 11 visits Baseline: 54 Goal status: INITIAL   2.  Improve left shoulder pain to consistently 0-3 out of 10 on the visual analog scale Baseline: 2-6 out of 10 Goal status: On Going 03/11/2022   3.  Improve bilateral shoulder flexion AROM to 170 degrees Baseline: 155 to 160 degrees Goal status: On Going 03/11/2022   4.  Improve left shoulder strength to at least 90% of the uninvolved right Baseline: 51% Goal status: INITIAL   5.  Patrick [name] will be independent with his long-term home exercise program at discharge Baseline: Started 03/02/2022 Goal status: On Going 03/11/2022   PLAN:   PT FREQUENCY: 1x/week   PT DURATION: 8 weeks   PLANNED INTERVENTIONS: Therapeutic exercises, Therapeutic  activity, Neuromuscular re-education, Patient/Family education, Self Care, Joint mobilization, Dry Needling, Cryotherapy, and Manual therapy   PLAN FOR NEXT SESSION: Review HEP.  Focus remains scapular and rotator cuff strengthening once IR AROM is normalized.   Farley Ly, PT, MPT 03/11/2022, 8:58 AM

## 2022-03-15 ENCOUNTER — Encounter: Payer: Self-pay | Admitting: Orthopaedic Surgery

## 2022-03-18 ENCOUNTER — Encounter: Payer: BC Managed Care – PPO | Admitting: Rehabilitative and Restorative Service Providers"

## 2022-03-19 ENCOUNTER — Ambulatory Visit (INDEPENDENT_AMBULATORY_CARE_PROVIDER_SITE_OTHER): Payer: 59 | Admitting: Rehabilitative and Restorative Service Providers"

## 2022-03-19 ENCOUNTER — Encounter: Payer: Self-pay | Admitting: Rehabilitative and Restorative Service Providers"

## 2022-03-19 ENCOUNTER — Other Ambulatory Visit: Payer: BC Managed Care – PPO

## 2022-03-19 DIAGNOSIS — M25512 Pain in left shoulder: Secondary | ICD-10-CM | POA: Diagnosis not present

## 2022-03-19 DIAGNOSIS — M6281 Muscle weakness (generalized): Secondary | ICD-10-CM

## 2022-03-19 NOTE — Therapy (Signed)
OUTPATIENT PHYSICAL THERAPY TREATMENT/PROGRESS NOTE   Patient Name: Patrick [name] MRN: [id number] DOB:1960/05/03, 62 y.o., male Today's Date: 03/19/2022  Progress Note Reporting Period 03/02/2022 to 03/19/2022  See note below for Objective Data and Assessment of Progress/Goals.     END OF SESSION:   PT End of Session - 03/19/22 1016     Visit Number 3    Number of Visits 8    Date for PT Re-Evaluation 04/27/22    Authorization Type AETNA    Progress Note Due on Visit 8    PT Start Time 0939    PT Stop Time 1017    PT Time Calculation (min) 38 min    Activity Tolerance Patient tolerated treatment well;No increased pain    Behavior During Therapy WFL for tasks assessed/performed             Past Medical History:  Diagnosis Date   ADHD (attention deficit hyperactivity disorder), inattentive type    sees Dr. Toy Care    Anxiety    sees Dr. Toy Care    Arthritis    DDD (degenerative disc disease), lumbar    Depression    sees Dr. Toy Care    Environmental allergies    GERD (gastroesophageal reflux disease)    History of blood transfusion    Hypertension    Hypothyroidism    partial thyroidectomy for benign nodule    Past Surgical History:  Procedure Laterality Date   BACK SURGERY Right 1985   microdiscectomy   COLONOSCOPY  2016   per Dr. Paulita Fujita, an unknown type of polyp was removed. repeat in 5 yrs    THYROIDECTOMY, PARTIAL  1998   TOTAL HIP ARTHROPLASTY Left 05/02/2012   Procedure: LEFT TOTAL HIP ARTHROPLASTY ANTERIOR APPROACH;  Surgeon: Mauri Pole, MD;  Location: [facility];  Service: Orthopedics;  Laterality: Left;   Patient Active Problem List   Diagnosis Date Noted   Preoperative testing 12/26/2020   Chronic right hip pain 08/20/2020   BPH with urinary obstruction 10/17/2018   Gout 08/11/2016   Depression with anxiety 08/11/2016   HTN (hypertension) 08/11/2016   ADHD (attention deficit hyperactivity disorder), inattentive type 08/11/2016   Hypothyroidism  08/11/2016   Environmental allergies 08/11/2016   GERD (gastroesophageal reflux disease) 08/11/2016   Overweight (BMI 25.0-29.9) 05/03/2012     THERAPY DIAG:  Acute pain of left shoulder  Muscle weakness (generalized)  PCP: Laurey Morale, MD   REFERRING PROVIDER: Vanetta Mulders, MD   REFERRING DIAG:  Diagnosis  M25.512 (ICD-10-CM) - Acute pain of left shoulder      THERAPY DIAG:  Acute pain of left shoulder   Muscle weakness (generalized)   Rationale for Evaluation and Treatment: Rehabilitation   ONSET DATE: 01/11/2023   SUBJECTIVE:  SUBJECTIVE STATEMENT: [name] (pronounced Per-Okie) has made progress with his AROM, strength and self-reported function and only 3 physical therapy visits.  Pain at night is common.  His original injury was reaching for a baking sheet in an awkward position on Christmas Eve and he felt a "pop" in his left shoulder.  Pain at the end of the day is most limiting and is consistent with current strength assessments.   PERTINENT HISTORY: ADHD, OA, lumbar DDD, HTN, hypothyroid, previous microdiscectomy in 1985, Lt anterior THA in 2014   PAIN:  Are you having pain? Yes: NPRS scale: 2-6/10 this week Pain location: Left shoulder Pain description: Ache with stabbing in certain positions Aggravating factors: Dogs pull on leash, putting a shirt on, when elbow leaves the body Relieving factors: Prescription pain meds   PRECAUTIONS: None   WEIGHT BEARING RESTRICTIONS: No   FALLS:  Has patient fallen in last 6 months? 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   PATIENT SURVEYS:  03/19/2022 FOTO 62 Eval FOTO 55 (Goal 67 in 11 visits)   COGNITION: Overall cognitive status: Within functional limits for tasks assessed                                  SENSATION: No complaints of peripheral pain, numbness or paresthesias   POSTURE: Rounded and protracted shoulders noted   UPPER EXTREMITY ROM:    Passive ROM Left/Right 03/02/2022 Left 03/11/2022  Left 03/19/2022  Shoulder flexion 155/160     Shoulder extension       Shoulder abduction       Shoulder horizontal adduction 40/40     Shoulder internal rotation 25/60  45 55  Shoulder external rotation 95/95     Elbow flexion       Elbow extension       Wrist flexion       Wrist extension       Wrist ulnar deviation       Wrist radial deviation       Wrist pronation       Wrist supination       (Blank rows = not tested)   UPPER EXTREMITY STRENGTH:   Strength in pounds Left/Right 03/02/2022 Left 03/19/2022   Shoulder flexion      Shoulder extension      Shoulder abduction      Shoulder adduction      Shoulder internal rotation 30.6/40.0  35.8  Shoulder external rotation 4.2/28.2  10.1  Middle trapezius      Lower trapezius      Elbow flexion      Elbow extension      Wrist flexion      Wrist extension      Wrist ulnar deviation      Wrist radial deviation      Wrist pronation      Wrist supination      Grip strength (lbs)      (Blank rows = not tested)               TODAY'S TREATMENT:  DATE:  03/19/2022 Shoulder blade pinches 10 x 5 seconds Supine arm raises, scapular protraction 20 x 3 seconds with 5 pound weight Supine IR stretch 10 x 10 seconds, with PT assistance Side-lying shoulder ER with towel roll under elbow 2 sets of 10 x 3 seconds 3# Theraband IR/ER Green 10X each slow  eccentrics Discussed results of reassessment today    03/11/2022 Shoulder blade pinches 10 x 5 seconds Supine arm raises, scapular protraction 20 x 3 seconds with 3 pound weight Supine IR stretch 10 x 10 seconds, with PT assistance Side-lying shoulder ER with towel roll under elbow 2 sets of 10 x 3 seconds 1# and 2# Theraband IR/ER Red and Green 10X each slow eccentrics Discussed using ice at the end of the day and a lumbar roll while on the computer for shoulders back posture   03/02/2022 Shoulder blade pinches 10 x 5 seconds Supine arm raises, scapular protraction 20 x 3 seconds with 3 pound weight Supine IR stretch 10 x 10 seconds, with PT assistance Side-lying shoulder ER with towel roll under elbow 10 x 3 seconds   Functional Activities: Reviewed shoulder anatomy with shoulder model, discussed anatomy as related to impingement and possible rotator cuff tear.  Discussed conservative and potential surgical management if a rotator cuff tear is noted upon MRI in March.     PATIENT EDUCATION: Education details: See above Person educated: Patient Education method: Explanation, Demonstration, Tactile cues, Verbal cues, and Handouts Education comprehension: verbalized understanding, returned demonstration, verbal cues required, tactile cues required, and needs further education   HOME EXERCISE PROGRAM: ASSESSMENT: Access Code: [place]: [email or web address] Date: 03/19/2022 Prepared by: Vista Mink  Exercises - Standing Scapular Retraction  - 5 x daily - 7 x weekly - 1 sets - 5 reps - 5 second hold - Supine Scapular Protraction in Flexion with Dumbbells  - 1 x daily - 7 x weekly - 1 sets - 20 reps - 3 seconds hold - Sidelying Shoulder External Rotation Dumbbell  - 1 x daily - 7 x weekly - 2 sets - 10 reps - Supine Shoulder Internal Rotation Stretch  - 1 x daily - 7 x weekly - 1 sets - 10 reps - 10 seconds hold - Shoulder External Rotation with Anchored  Resistance  - 1 x daily - 7 x weekly - 2 sets - 10 reps - 3 hold - Shoulder Internal Rotation with Resistance  - 1 x daily - 7 x weekly - 1 sets - 10 reps - 3 hold  CLINICAL IMPRESSION: [name] has shown progress with his posterior capsule flexibility, scapular and rotator cuff strength since starting physical therapy.  Of significant concern is his still very weak external rotation strength.  This is usually seen in cases of a rotator cuff tear or severe tendonitis/tendonopathy.  [name] has an MRI scheduled next week before he follows up with Dr. Sammuel Hines for his recommendations.  His file will be placed on hold until we get MRI results and recommendations from Dr. Sammuel Hines.   OBJECTIVE IMPAIRMENTS: decreased activity tolerance, decreased endurance, decreased knowledge of condition, decreased ROM, decreased strength, decreased safety awareness, impaired perceived functional ability, impaired UE functional use, postural dysfunction, and pain.    ACTIVITY LIMITATIONS: carrying, lifting, dressing, and reach over head   PARTICIPATION LIMITATIONS: community activity and occupation   PERSONAL FACTORS: ADHD, OA, lumbar DDD, HTN, hypothyroid, previous microdiscectomy in 1985, Lt anterior [place] in 2014 are also affecting patient's functional outcome.    REHAB POTENTIAL: Good  CLINICAL DECISION MAKING: Stable/uncomplicated   EVALUATION COMPLEXITY: Low     GOALS: Goals reviewed with patient? Yes   SHORT TERM GOALS: Target date: 03/30/2022   Improve left shoulder IR AROM to at least 60 degrees Baseline: 25 degrees Goal status: On Going 03/19/2022   2.  [name] will be independent with his day 1 home exercise program Baseline: Started 03/02/2022 Goal status: Met 03/19/2022   3.  Improve left shoulder ER strength to at least 10 pounds Baseline: 4.2 pounds Goal status: Met 03/19/2022     LONG TERM GOALS: Target date: 04/28/2022   Improve FOTO to 67 in 11 visits Baseline: 54 Goal status: On Going  03/19/2022   2.  Improve left shoulder pain to consistently 0-3 out of 10 on the visual analog scale Baseline: 2-6 out of 10 Goal status: On Going 03/19/2022   3.  Improve bilateral shoulder flexion AROM to 170 degrees Baseline: 155 to 160 degrees Goal status: On Going 03/19/2022   4.  Improve left shoulder strength to at least 90% of the uninvolved right Baseline: 51% Goal status: On Going 03/19/2022   5.  [name] will be independent with his long-term home exercise program at discharge Baseline: Started 03/02/2022 Goal status: On Going 03/19/2022   PLAN:   PT FREQUENCY: 1x/week   PT DURATION: 8 weeks   PLANNED INTERVENTIONS: Therapeutic exercises, Therapeutic activity, Neuromuscular re-education, Patient/Family education, Self Care, Joint mobilization, Dry Needling, Cryotherapy, and Manual therapy   PLAN FOR NEXT SESSION: See what his MRI and Dr. Sammuel Hines said.  Focus remains scapular and rotator cuff strengthening once IR and flexion AROM is normalized.   Farley Ly, PT, MPT 03/19/2022, 1:47 PM

## 2022-03-23 ENCOUNTER — Ambulatory Visit
Admission: RE | Admit: 2022-03-23 | Discharge: 2022-03-23 | Disposition: A | Discharge status: Home / Self Care | Payer: 59 | Source: Ambulatory Visit | Attending: Orthopaedic Surgery | Admitting: Orthopaedic Surgery

## 2022-03-23 DIAGNOSIS — M25512 Pain in left shoulder: Secondary | ICD-10-CM

## 2022-03-23 MED ORDER — IOPAMIDOL (ISOVUE-M 200) INJECTION 41%
15.0000 mL | Freq: Once | INTRAMUSCULAR | Status: AC
  Administered 2022-03-23: 15 mL via INTRA_ARTICULAR

## 2022-03-25 ENCOUNTER — Encounter: Payer: Self-pay | Admitting: Rehabilitative and Restorative Service Providers"

## 2022-03-25 ENCOUNTER — Ambulatory Visit: Payer: 59 | Admitting: Rehabilitative and Restorative Service Providers"

## 2022-03-25 DIAGNOSIS — M6281 Muscle weakness (generalized): Secondary | ICD-10-CM | POA: Diagnosis not present

## 2022-03-25 DIAGNOSIS — M25512 Pain in left shoulder: Secondary | ICD-10-CM

## 2022-03-25 NOTE — Therapy (Addendum)
OUTPATIENT PHYSICAL THERAPY TREATMENT NOTE  /DISCHARGE   Patient Name: Patrick [name] MRN: [id number] DOB:01/29/1960, 62 y.o., male Today's Date: 03/25/2022    END OF SESSION:   PT End of Session - 03/25/22 0804     Visit Number 4    Number of Visits 8    Date for PT Re-Evaluation 04/27/22    Authorization Type AET[employer]    Progress Note Due on Visit 8    PT Start Time 0803    PT Stop Time 0833    PT Time Calculation (min) 30 min    Activity Tolerance Patient tolerated treatment well;No increased pain    Behavior During Therapy WFL for tasks assessed/performed              Past Medical History:  Diagnosis Date   ADHD (attention deficit hyperactivity disorder), inattentive type    sees Dr. Evelene [name]    Anxiety    sees Dr. Evelene [name]    Arthritis    DDD (degenerative disc disease), lumbar    Depression    sees Dr. Evelene [name]    Environmental allergies    GERD (gastroesophageal reflux disease)    History of blood transfusion    Hypertension    Hypothyroidism    partial thyroidectomy for benign nodule    Past Surgical History:  Procedure Laterality Date   BACK SURGERY Right 1985   microdiscectomy   COLONOSCOPY  2016   per Dr. Dulce [name], an unknown type of polyp was removed. repeat in 5 yrs    THYROIDECTOMY, PARTIAL  1998   TOTAL HIP ARTHROPLASTY Left 05/02/2012   Procedure: LEFT TOTAL HIP ARTHROPLASTY ANTERIOR APPROACH;  Surgeon: Shelda Pal, MD;  Location: [facility];  Service: Orthopedics;  Laterality: Left;   Patient Active Problem List   Diagnosis Date Noted   Preoperative testing 12/26/2020   Chronic right hip pain 08/20/2020   BPH with urinary obstruction 10/17/2018   Gout 08/11/2016   Depression with anxiety 08/11/2016   HTN (hypertension) 08/11/2016   ADHD (attention deficit hyperactivity disorder), inattentive type 08/11/2016   Hypothyroidism 08/11/2016   Environmental allergies 08/11/2016   GERD (gastroesophageal reflux disease) 08/11/2016   Overweight (BMI  25.0-29.9) 05/03/2012     THERAPY DIAG:  Acute pain of left shoulder  Muscle weakness (generalized)  PCP: Patrick Salisbury, MD   REFERRING PROVIDER: Huel Cote, MD   REFERRING DIAG:  Diagnosis  M25.512 (ICD-10-CM) - Acute pain of left shoulder      THERAPY DIAG:  Acute pain of left shoulder   Muscle weakness (generalized)   Rationale for Evaluation and Treatment: Rehabilitation   ONSET DATE: 01/11/2023   SUBJECTIVE:  SUBJECTIVE STATEMENT: [name] (pronounced Per-Okie) notes less frequent 6/10 pain.  Less functional difficulty, although some improvement may be due to changing the way he does things.  Good HEP compliance, although some questions.  PERTINENT HISTORY: ADHD, OA, lumbar DDD, HTN, hypothyroid, previous microdiscectomy in 1985, Lt anterior THA in 2014   PAIN:  Are you having pain? Yes: NPRS scale: 2-6/10 this week Pain location: Left shoulder Pain description: Ache with stabbing in certain positions Aggravating factors: Dogs pull on leash, putting a shirt on, when elbow leaves the body Relieving factors: Prescription pain meds   PRECAUTIONS: None   WEIGHT BEARING RESTRICTIONS: No   FALLS:  Has patient fallen in last 6 months? 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   PATIENT SURVEYS:  03/19/2022 FOTO 62 Eval FOTO 55 (Goal 67 in 11 visits)   COGNITION: Overall cognitive status: Within functional limits for  tasks assessed                                  SENSATION: No complaints of peripheral pain, numbness or paresthesias   POSTURE: Rounded and protracted shoulders noted   UPPER EXTREMITY ROM:    Passive ROM Left/Right 03/02/2022 Left 03/11/2022  Left 03/19/2022 Left 03/25/2022  Shoulder flexion 155/160    165  Shoulder extension        Shoulder abduction        Shoulder horizontal adduction 40/40      Shoulder internal rotation 25/60  45 55 55  Shoulder external rotation 95/95      Elbow flexion        Elbow extension        Wrist flexion        Wrist extension        Wrist ulnar deviation        Wrist radial deviation        Wrist pronation        Wrist supination        (Blank rows = not tested)   UPPER EXTREMITY STRENGTH:   Strength in pounds Left/Right 03/02/2022 Left 03/19/2022  Left 03/25/2022  Shoulder flexion       Shoulder extension       Shoulder abduction       Shoulder adduction       Shoulder internal rotation 30.6/40.0  35.8 36.6  Shoulder external rotation 4.2/28.2  10.1 10.1  Middle trapezius       Lower trapezius       Elbow flexion       Elbow extension       Wrist flexion       Wrist extension       Wrist ulnar deviation       Wrist radial deviation       Wrist pronation       Wrist supination       Grip strength (lbs)       (Blank rows = not tested)               TODAY'S TREATMENT:  DATE:  03/25/2022 -Shoulder blade pinches 10 x 5 seconds Supine arm raises, scapular protraction 20 x 3 seconds with 5 pound weight Supine IR stretch 10 x 10 seconds, with PT assistance Side-lying shoulder ER with towel roll under elbow 2 sets of 10 x 3 seconds 3# Theraband IR Green 10X slow eccentrics Theraband ER Green 2 sets of 10 slow eccentrics   03/19/2022 Shoulder blade pinches 10 x 5 seconds Supine arm raises, scapular protraction 20 x 3 seconds with 5 pound weight Supine IR stretch 10 x 10 seconds, with PT assistance Side-lying  shoulder ER with towel roll under elbow 2 sets of 10 x 3 seconds 3# Theraband IR/ER Green 10X each slow eccentrics Discussed results of reassessment today    03/11/2022 Shoulder blade pinches 10 x 5 seconds Supine arm raises, scapular protraction 20 x 3 seconds with 3 pound weight Supine IR stretch 10 x 10 seconds, with PT assistance Side-lying shoulder ER with towel roll under elbow 2 sets of 10 x 3 seconds 1# and 2# Theraband IR/ER Red and Green 10X each slow eccentrics Discussed using ice at the end of the day and a lumbar roll while on the computer for shoulders back posture   03/02/2022 Shoulder blade pinches 10 x 5 seconds Supine arm raises, scapular protraction 20 x 3 seconds with 3 pound weight Supine IR stretch 10 x 10 seconds, with PT assistance Side-lying shoulder ER with towel roll under elbow 10 x 3 seconds   Functional Activities: Reviewed shoulder anatomy with shoulder model, discussed anatomy as related to impingement and possible rotator cuff tear.  Discussed conservative and potential surgical management if a rotator cuff tear is noted upon MRI in March.     PATIENT EDUCATION: Education details: See above Person educated: Patient Education method: Explanation, Demonstration, Tactile cues, Verbal cues, and Handouts Education comprehension: verbalized understanding, returned demonstration, verbal cues required, tactile cues required, and needs further education   HOME EXERCISE PROGRAM: ASSESSMENT: Access Code: [place]: [email or web address] Date: 03/19/2022 Prepared by: Vista Mink  Exercises - Standing Scapular Retraction  - 5 x daily - 7 x weekly - 1 sets - 5 reps - 5 second hold - Supine Scapular Protraction in Flexion with Dumbbells  - 1 x daily - 7 x weekly - 1 sets - 20 reps - 3 seconds hold - Sidelying Shoulder External Rotation Dumbbell  - 1 x daily - 7 x weekly - 2 sets - 10 reps - Supine Shoulder Internal Rotation Stretch  - 1 x  daily - 7 x weekly - 1 sets - 10 reps - 10 seconds hold - Shoulder External Rotation with Anchored Resistance  - 1 x daily - 7 x weekly - 2 sets - 10 reps - 3 hold - Shoulder Internal Rotation with Resistance  - 1 x daily - 7 x weekly - 1 sets - 10 reps - 3 hold  CLINICAL IMPRESSION: [name] had his MRI yesterday and he follows up with Dr. Sammuel Hines tomorrow for his recommendations.  We reviewed his HEP with minor corrections completed.  He was asked to email me tomorrow after his appointment with Dr. Sammuel Hines and let me know what they discussed to decide whether or not he will keep his appointment next Thursday.  If [name] is a surgical candidate, we will likely cancel his last PT appointment and have him continue his exercises independently until after surgery.  If not, I will follow-up with him next Thursday.   OBJECTIVE IMPAIRMENTS: decreased activity tolerance, decreased  endurance, decreased knowledge of condition, decreased ROM, decreased strength, decreased safety awareness, impaired perceived functional ability, impaired UE functional use, postural dysfunction, and pain.    ACTIVITY LIMITATIONS: carrying, lifting, dressing, and reach over head   PARTICIPATION LIMITATIONS: community activity and occupation   PERSONAL FACTORS: ADHD, OA, lumbar DDD, HTN, hypothyroid, previous microdiscectomy in 1985, Lt anterior THA in 2014 are also affecting patient's functional outcome.    REHAB POTENTIAL: Good   CLINICAL DECISION MAKING: Stable/uncomplicated   EVALUATION COMPLEXITY: Low     GOALS: Goals reviewed with patient? Yes   SHORT TERM GOALS: Target date: 03/30/2022   Improve left shoulder IR AROM to at least 60 degrees Baseline: 25 degrees Goal status: On Going 03/24/2022   2.  [name] will be independent with his day 1 home exercise program Baseline: Started 03/02/2022 Goal status: Met 03/19/2022   3.  Improve left shoulder ER strength to at least 10 pounds Baseline: 4.2 pounds Goal  status: Met 03/19/2022     LONG TERM GOALS: Target date: 04/28/2022   Improve FOTO to 67 in 11 visits Baseline: 54 Goal status: On Going 03/19/2022   2.  Improve left shoulder pain to consistently 0-3 out of 10 on the visual analog scale Baseline: 2-6 out of 10 Goal status: On Going 03/24/2022   3.  Improve bilateral shoulder flexion AROM to 170 degrees Baseline: 155 to 160 degrees Goal status: On Going 03/24/2022   4.  Improve left shoulder strength to at least 90% of the uninvolved right Baseline: 51% Goal status: On Going 03/24/2022   5.  [name] will be independent with his long-term home exercise program at discharge Baseline: Started 03/02/2022 Goal status: On Going 03/24/2022   PLAN:   PT FREQUENCY: 1x/week   PT DURATION: 8 weeks   PLANNED INTERVENTIONS: Therapeutic exercises, Therapeutic activity, Neuromuscular re-education, Patient/Family education, Self Care, Joint mobilization, Dry Needling, Cryotherapy, and Manual therapy   PLAN FOR NEXT SESSION: See what his MRI and Dr. Steward Drone said.  Focus remains scapular and rotator cuff strengthening once IR and flexion AROM is normalized.   Cherlyn Cushing, PT, MPT 03/25/2022, 8:40 AM    PHYSICAL THERAPY DISCHARGE SUMMARY  Visits from Start of Care: 4  Current functional level related to goals / functional outcomes: See note   Remaining deficits: See note   Education / Equipment: HEP  Patient goals were not met. Patient is being discharged due to  surgery to be performed.  Chyrel Masson, PT, DPT, OCS, ATC 05/19/22  3:45 PM

## 2022-03-26 ENCOUNTER — Other Ambulatory Visit (HOSPITAL_BASED_OUTPATIENT_CLINIC_OR_DEPARTMENT_OTHER): Payer: Self-pay

## 2022-03-26 ENCOUNTER — Ambulatory Visit (HOSPITAL_BASED_OUTPATIENT_CLINIC_OR_DEPARTMENT_OTHER): Payer: 59 | Admitting: Orthopaedic Surgery

## 2022-03-26 ENCOUNTER — Ambulatory Visit (HOSPITAL_BASED_OUTPATIENT_CLINIC_OR_DEPARTMENT_OTHER): Payer: Self-pay | Admitting: Orthopaedic Surgery

## 2022-03-26 DIAGNOSIS — S46012A Strain of muscle(s) and tendon(s) of the rotator cuff of left shoulder, initial encounter: Secondary | ICD-10-CM

## 2022-03-26 MED ORDER — OXYCODONE HCL 5 MG PO TABS
5.0000 mg | ORAL_TABLET | ORAL | 0 refills | Status: DC | PRN
  Filled 2022-03-26: qty 20, 4d supply, fill #0

## 2022-03-26 MED ORDER — IBUPROFEN 800 MG PO TABS
800.0000 mg | ORAL_TABLET | Freq: Three times a day (TID) | ORAL | 0 refills | Status: AC
  Filled 2022-03-26: qty 30, 10d supply, fill #0

## 2022-03-26 MED ORDER — ACETAMINOPHEN 500 MG PO TABS
500.0000 mg | ORAL_TABLET | Freq: Three times a day (TID) | ORAL | 0 refills | Status: AC
  Filled 2022-03-26: qty 30, 10d supply, fill #0

## 2022-03-26 MED ORDER — ASPIRIN 325 MG PO TBEC
325.0000 mg | DELAYED_RELEASE_TABLET | Freq: Every day | ORAL | 0 refills | Status: DC
  Filled 2022-03-26: qty 14, 14d supply, fill #0

## 2022-03-26 NOTE — Progress Notes (Signed)
Chief Complaint: Left shoulder pain     History of Present Illness:   03/26/2022: Presents today for follow-up of his left shoulder.  He is still experiencing pain with overhead activity as well as weakness.  He is experiencing pinching of the shoulder with difficulty laying on the side.  He is here today for MRI discussion.  Patrick [name] is a 62 y.o. male right-hand-dominant male presents with left shoulder pain after an injury on January 10, 2022 where he felt a pop in the shoulder that he had a cookie sheet.  Since that time the shoulder has felt unstable and feels like it is giving out with persistent pain.  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  He is trialed anti-inflammatories with limited relief.  He does enjoy being active in the spare time.  He is here today for further assessment.    Surgical History:   none  PMH/PSH/Family History/Social History/Meds/Allergies:    Past Medical History:  Diagnosis Date   ADHD (attention deficit hyperactivity disorder), inattentive type    sees Dr. Toy Care    Anxiety    sees Dr. Toy Care    Arthritis    DDD (degenerative disc disease), lumbar    Depression    sees Dr. Toy Care    Environmental allergies    GERD (gastroesophageal reflux disease)    History of blood transfusion    Hypertension    Hypothyroidism    partial thyroidectomy for benign nodule    Past Surgical History:  Procedure Laterality Date   BACK SURGERY Right 1985   microdiscectomy   COLONOSCOPY  2016   per Dr. Paulita Fujita, an unknown type of polyp was removed. repeat in 5 yrs    THYROIDECTOMY, PARTIAL  1998   TOTAL HIP ARTHROPLASTY Left 05/02/2012   Procedure: LEFT TOTAL HIP ARTHROPLASTY ANTERIOR APPROACH;  Surgeon: Mauri Pole, MD;  Location: [facility];  Service: Orthopedics;  Laterality: Left;   Social History   Socioeconomic History   Marital status: Married    Spouse name: Not on file   Number of children:  Not on file   Years of education: Not on file   Highest education level: Not on file  Occupational History   Not on file  Tobacco Use   Smoking status: Former    Types: Cigarettes    Quit date: 04/27/2002    Years since quitting: 19.9   Smokeless tobacco: Former    Quit date: 04/27/2002  Substance and Sexual Activity   Alcohol use: Yes    Comment: 3-4 glasses wine/beer  week   Drug use: No   Sexual activity: Not on file  Other Topics Concern   Not on file  Social History Narrative   Not on file   Social Determinants of Health   Financial Resource Strain: Not on file  Food Insecurity: No Food Insecurity (01/06/2022)   Hunger Vital Sign    Worried About Running Out of Food in the Last Year: Never true    Ran Out of Food in the Last Year: Never true  Transportation Needs: No Transportation Needs (01/06/2022)   PRAPARE - Hydrologist (Medical): No    Lack of Transportation (Non-Medical): No  Physical Activity: Not on file  Stress: Not on file  Social Connections: Not on file   No family history on file. Allergies  Allergen Reactions   Aspirin Hives   Current Outpatient Medications  Medication Sig Dispense Refill   allopurinol (ZYLOPRIM) 100 MG tablet TAKE 1 TABLET BY MOUTH EVERY DAY 90 tablet 0   amLODipine (NORVASC) 5 MG tablet TAKE 1 TABLET BY MOUTH TWICE DAILY 180 tablet 0   amphetamine-dextroamphetamine (ADDERALL XR) 30 MG 24 hr capsule Take 1 capsule (30 mg total) by mouth every morning. 30 capsule 0   FLUoxetine (PROZAC) 40 MG capsule Take 40 mg by mouth daily.     hydrochlorothiazide (HYDRODIURIL) 25 MG tablet TAKE 1 TABLET BY MOUTH DAILY 90 tablet 0   levothyroxine (SYNTHROID) 50 MCG tablet TAKE 1 TABLET(50 MCG) BY MOUTH DAILY BEFORE BREAKFAST 90 tablet 0   loratadine (CLARITIN) 10 MG tablet Take 10 mg by mouth daily.     losartan (COZAAR) 100 MG tablet TAKE 1 TABLET BY MOUTH EVERY DAY 90 tablet 0   omeprazole (PRILOSEC) 20 MG capsule  TAKE 1 CAPSULE BY MOUTH DAILY 90 capsule 3   propranolol ER (INDERAL LA) 80 MG 24 hr capsule TAKE 1 CAPSULE BY MOUTH DAILY BEFORE BREAKFAST 90 capsule 0   No current facility-administered medications for this visit.   No results found.  Review of Systems:   A ROS was performed including pertinent positives and negatives as documented in the HPI.  Physical Exam :   Constitutional: NAD and appears stated age Neurological: Alert and oriented Psych: Appropriate affect and cooperative There were no vitals taken for this visit.   Comprehensive Musculoskeletal Exam:    Musculoskeletal Exam    Inspection Right Left  Skin No atrophy or winging No atrophy or winging  Palpation    Tenderness None Glenohumeral, lateral deltoid  Range of Motion    Flexion (passive) 170 170  Flexion (active) 170 170  Abduction 170 170  ER at the side 70 70  Can reach behind back to T12 T12  Strength     5/5 5/5, negative belly press  Special Tests    Pseudoparalytic No No  Neurologic    Fires PIN, radial, median, ulnar, musculocutaneous, axillary, suprascapular, long thoracic, and spinal accessory innervated muscles. No abnormal sensibility  Vascular/Lymphatic    Radial Pulse 2+ 2+  Cervical Exam    Patient has symmetric cervical range of motion with negative Spurling's test.  Special Test: Positive Neer and agement with negative speeds test     Imaging:   Xray (3 views left shoulder): Normal  Left shoulder MRI: Full-thickness supraspinatus tear with minimal retraction.  There is healthy appearing muscle belly on the T1 sagittal view  I personally reviewed and interpreted the radiographs.   Assessment:   62 y.o. male right-hand-dominant male presents with left shoulder pain consistent with left shoulder acute full-thickness rotator cuff tear.  I did discuss treatment options.  He has been undergoing physical therapy with some improvement although this has been limited.  Unfortunately we did  discuss the possibility of progression of rotator cuff tear.  I do believe this is been adding to a component of instability of the left shoulder as well.  After discussion of treatment options he would like to undergo left shoulder arthroscopy with rotator cuff repair.  I did discuss the specific rehab and limitations associated with this.  Plan :    -Plan for left shoulder arthroscopy with rotator cuff repair   After a lengthy discussion of treatment options, including risks, benefits,  alternatives, complications of surgical and nonsurgical conservative options, the patient elected surgical repair.   The patient  is aware of the material risks  and complications including, but not limited to injury to adjacent structures, neurovascular injury, infection, numbness, bleeding, implant failure, thermal burns, stiffness, persistent pain, failure to heal, disease transmission from allograft, need for further surgery, dislocation, anesthetic risks, blood clots, risks of death,and others. The probabilities of surgical success and failure discussed with patient given their particular co-morbidities.The time and nature of expected rehabilitation and recovery was discussed.The patient's questions were all answered preoperatively.  No barriers to understanding were noted. I explained the natural history of the disease process and Rx rationale.  I explained to the patient what I considered to be reasonable expectations given their personal situation.  The final treatment plan was arrived at through a shared patient decision making process model.      I personally saw and evaluated the patient, and participated in the management and treatment plan.  Vanetta Mulders, MD Attending Physician, Orthopedic Surgery  This document was dictated using Dragon voice recognition software. A reasonable attempt at proof reading has been made to minimize errors.

## 2022-03-30 ENCOUNTER — Encounter (HOSPITAL_BASED_OUTPATIENT_CLINIC_OR_DEPARTMENT_OTHER): Payer: Self-pay | Admitting: Orthopaedic Surgery

## 2022-03-31 ENCOUNTER — Ambulatory Visit (INDEPENDENT_AMBULATORY_CARE_PROVIDER_SITE_OTHER): Payer: 59 | Admitting: Family Medicine

## 2022-03-31 ENCOUNTER — Encounter: Payer: Self-pay | Admitting: Family Medicine

## 2022-03-31 VITALS — BP 160/100 | HR 62 | Temp 98.0°F | Ht 70.0 in | Wt 201.0 lb

## 2022-03-31 DIAGNOSIS — E890 Postprocedural hypothyroidism: Secondary | ICD-10-CM

## 2022-03-31 DIAGNOSIS — Z0000 Encounter for general adult medical examination without abnormal findings: Secondary | ICD-10-CM | POA: Diagnosis not present

## 2022-03-31 LAB — T3, FREE: T3, Free: 2.6 pg/mL (ref 2.3–4.2)

## 2022-03-31 LAB — T4, FREE: Free T4: 1.08 ng/dL (ref 0.60–1.60)

## 2022-03-31 LAB — CBC WITH DIFFERENTIAL/PLATELET
Basophils Absolute: 0 10*3/uL (ref 0.0–0.1)
Basophils Relative: 0.8 % (ref 0.0–3.0)
Eosinophils Absolute: 0.3 10*3/uL (ref 0.0–0.7)
Eosinophils Relative: 5.4 % — ABNORMAL HIGH (ref 0.0–5.0)
HCT: 47.6 % (ref 39.0–52.0)
Hemoglobin: 16.3 g/dL (ref 13.0–17.0)
Lymphocytes Relative: 21.6 % (ref 12.0–46.0)
Lymphs Abs: 1.1 10*3/uL (ref 0.7–4.0)
MCHC: 34.2 g/dL (ref 30.0–36.0)
MCV: 92.2 fl (ref 78.0–100.0)
Monocytes Absolute: 0.6 10*3/uL (ref 0.1–1.0)
Monocytes Relative: 12 % (ref 3.0–12.0)
Neutro Abs: 3 10*3/uL (ref 1.4–7.7)
Neutrophils Relative %: 60.2 % (ref 43.0–77.0)
Platelets: 167 10*3/uL (ref 150.0–400.0)
RBC: 5.17 Mil/uL (ref 4.22–5.81)
RDW: 14.2 % (ref 11.5–15.5)
WBC: 5 10*3/uL (ref 4.0–10.5)

## 2022-03-31 LAB — BASIC METABOLIC PANEL
BUN: 15 mg/dL (ref 6–23)
CO2: 30 mEq/L (ref 19–32)
Calcium: 8.9 mg/dL (ref 8.4–10.5)
Chloride: 104 mEq/L (ref 96–112)
Creatinine, Ser: 0.7 mg/dL (ref 0.40–1.50)
GFR: 100.55 mL/min (ref >60.00)
Glucose, Bld: 103 mg/dL — ABNORMAL HIGH (ref 70–99)
Potassium: 4.1 mEq/L (ref 3.5–5.1)
Sodium: 141 mEq/L (ref 135–145)

## 2022-03-31 LAB — HEPATIC FUNCTION PANEL
ALT: 10 U/L (ref 0–53)
AST: 16 U/L (ref 0–37)
Albumin: 4.2 g/dL (ref 3.5–5.2)
Alkaline Phosphatase: 71 U/L (ref 39–117)
Bilirubin, Direct: 0.2 mg/dL (ref 0.0–0.3)
Total Bilirubin: 0.8 mg/dL (ref 0.2–1.2)
Total Protein: 6.3 g/dL (ref 6.0–8.3)

## 2022-03-31 LAB — LIPID PANEL
Cholesterol: 176 mg/dL (ref 0–200)
HDL: 53.5 mg/dL (ref >39.00)
LDL Cholesterol: 112 mg/dL — ABNORMAL HIGH (ref 0–99)
NonHDL: 122.07
Total CHOL/HDL Ratio: 3
Triglycerides: 49 mg/dL (ref 0.0–149.0)
VLDL: 9.8 mg/dL (ref 0.0–40.0)

## 2022-03-31 LAB — TSH: TSH: 1.14 u[IU]/mL (ref 0.35–5.50)

## 2022-03-31 LAB — HEMOGLOBIN A1C: Hgb A1c MFr Bld: 5.6 % (ref 4.6–6.5)

## 2022-03-31 LAB — PSA: PSA: 0.62 ng/mL (ref 0.10–4.00)

## 2022-03-31 MED ORDER — AMPHETAMINE-DEXTROAMPHET ER 30 MG PO CP24: 30.0000 mg | ORAL_CAPSULE | ORAL | 0 refills | Status: AC

## 2022-03-31 MED ORDER — AMLODIPINE BESYLATE 5 MG PO TABS: 2.5000 mg | ORAL_TABLET | Freq: Two times a day (BID) | ORAL | 0 refills | Status: DC

## 2022-03-31 MED ORDER — TAMSULOSIN HCL 0.4 MG PO CAPS: 0.4000 mg | ORAL_CAPSULE | Freq: Every day | ORAL | 3 refills | Status: DC

## 2022-03-31 NOTE — Progress Notes (Signed)
Subjective:    Patient ID: Patrick [name], male    DOB: 1960/06/28, 62 y.o.   MRN: [id number]  HPI Here for a well exam. He feels well in general. He has a torn left rotator cuff, and he is seeing Dr. Sammuel Hines for this. They plan a surgical repair sometime later this year. His BP has been up and down. We saw him last December when he was having orthostatic spells, so we stopped the Amlodipine and the HCTZ. His BP was stable for awhile, but it started going up again a month ago. He put himself back on the Amlodipine and HCTZ for 2 weeks, but his BP began dropping too low again so he stopped these meds again. He still sees Dr. Toy Care regularly. He also mentions having to get up to urinate 5-6 times every night. He tired saw palmetto in the past with no improvement.    Review of Systems  Constitutional: Negative.   HENT: Negative.    Eyes: Negative.   Respiratory: Negative.    Cardiovascular: Negative.   Gastrointestinal: Negative.   Genitourinary:  Positive for difficulty urinating and frequency.  Musculoskeletal:  Positive for arthralgias.  Skin: Negative.   Neurological: Negative.   Psychiatric/Behavioral: Negative.         Objective:   Physical Exam Constitutional:      General: He is not in acute distress.    Appearance: Normal appearance. He is well-developed. He is not diaphoretic.  HENT:     Head: Normocephalic and atraumatic.     Right Ear: External ear normal.     Left Ear: External ear normal.     Nose: Nose normal.     Mouth/Throat:     Pharynx: No oropharyngeal exudate.  Eyes:     General: No scleral icterus.       Right eye: No discharge.        Left eye: No discharge.     Conjunctiva/sclera: Conjunctivae normal.     Pupils: Pupils are equal, round, and reactive to light.  Neck:     Thyroid: No thyromegaly.     Vascular: No JVD.     Trachea: No tracheal deviation.  Cardiovascular:     Rate and Rhythm: Normal rate and regular rhythm.     Heart sounds:  Normal heart sounds. No murmur heard.    No friction rub. No gallop.  Pulmonary:     Effort: Pulmonary effort is normal. No respiratory distress.     Breath sounds: Normal breath sounds. No wheezing or rales.  Chest:     Chest wall: No tenderness.  Abdominal:     General: Bowel sounds are normal. There is no distension.     Palpations: Abdomen is soft. There is no mass.     Tenderness: There is no abdominal tenderness. There is no guarding or rebound.  Genitourinary:    Penis: Normal. No tenderness.      Prostate: Normal.     Rectum: Normal. Guaiac result negative.  Musculoskeletal:        General: No tenderness. Normal range of motion.     Cervical back: Neck supple.  Lymphadenopathy:     Cervical: No cervical adenopathy.  Skin:    General: Skin is warm and dry.     Coloration: Skin is not pale.     Findings: No erythema or rash.  Neurological:     Mental Status: He is alert and oriented to person, place, and time.     Cranial  Nerves: No cranial nerve deficit.     Motor: No abnormal muscle tone.     Coordination: Coordination normal.     Deep Tendon Reflexes: Reflexes are normal and symmetric. Reflexes normal.  Psychiatric:        Behavior: Behavior normal.        Thought Content: Thought content normal.        Judgment: Judgment normal.           Assessment & Plan:  Well exam. We discussed diet and exercise. Get fasting labs. For the HTN, he will try taking 1/2 tablet (2.5 mg) of Amlodipine BID. He will stay off HCTZ. He will monitor the BP and report back to [place] in 2 weeks. For the BPH, he will try Flomax 0.4 mg daily. Alysia Penna, MD

## 2022-04-01 ENCOUNTER — Encounter: Payer: BC Managed Care – PPO | Admitting: Rehabilitative and Restorative Service Providers"

## 2022-04-02 ENCOUNTER — Ambulatory Visit (HOSPITAL_BASED_OUTPATIENT_CLINIC_OR_DEPARTMENT_OTHER): Payer: 59 | Admitting: Orthopaedic Surgery

## 2022-04-02 DIAGNOSIS — S46012A Strain of muscle(s) and tendon(s) of the rotator cuff of left shoulder, initial encounter: Secondary | ICD-10-CM | POA: Diagnosis not present

## 2022-04-02 DIAGNOSIS — M25512 Pain in left shoulder: Secondary | ICD-10-CM

## 2022-04-02 MED ORDER — LIDOCAINE HCL 1 % IJ SOLN
4.0000 mL | INTRAMUSCULAR | Status: AC | PRN
  Administered 2022-04-02: 4 mL

## 2022-04-02 MED ORDER — TRIAMCINOLONE ACETONIDE 40 MG/ML IJ SUSP
80.0000 mg | INTRAMUSCULAR | Status: AC | PRN
  Administered 2022-04-02: 80 mg via INTRA_ARTICULAR

## 2022-04-02 NOTE — Progress Notes (Signed)
Chief Complaint: Left shoulder pain     History of Present Illness:   03/26/2022: Presents today for follow-up of his left shoulder.  He is still experiencing pain with overhead activity as well as weakness.  Overall he is hoping to get an injection in the left shoulder as he is hoping to get some relief prior to a trip to [place]  Per-Patrick [name] is a 62 y.o. male right-hand-dominant male presents with left shoulder pain after an injury on January 10, 2022 where he felt a pop in the shoulder that he had a cookie sheet.  Since that time the shoulder has felt unstable and feels like it is giving out with persistent pain.  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  He is trialed anti-inflammatories with limited relief.  He does enjoy being active in the spare time.  He is here today for further assessment.    Surgical History:   none  PMH/PSH/Family History/Social History/Meds/Allergies:    Past Medical History:  Diagnosis Date  . ADHD (attention deficit hyperactivity disorder), inattentive type    sees Dr. Toy Care   . Anxiety    sees Dr. Toy Care   . Arthritis   . DDD (degenerative disc disease), lumbar   . Depression    sees Dr. Toy Care   . Environmental allergies   . GERD (gastroesophageal reflux disease)   . History of blood transfusion   . Hypertension   . Hypothyroidism    partial thyroidectomy for benign nodule    Past Surgical History:  Procedure Laterality Date  . BACK SURGERY Right 1985   microdiscectomy  . COLONOSCOPY  2016   per Dr. Paulita Fujita, an unknown type of polyp was removed. repeat in 5 yrs   . THYROIDECTOMY, PARTIAL  1998  . TOTAL HIP ARTHROPLASTY Left 05/02/2012   Procedure: LEFT TOTAL HIP ARTHROPLASTY ANTERIOR APPROACH;  Surgeon: Mauri Pole, MD;  Location: [facility];  Service: Orthopedics;  Laterality: Left;   Social History   Socioeconomic History  . Marital status: Married    Spouse name: Not on  file  . Number of children: Not on file  . Years of education: Not on file  . Highest education level: Not on file  Occupational History  . Not on file  Tobacco Use  . Smoking status: Former    Types: Cigarettes    Quit date: 04/27/2002    Years since quitting: 19.9  . Smokeless tobacco: Former    Quit date: 04/27/2002  Substance and Sexual Activity  . Alcohol use: Yes    Comment: 3-4 glasses wine/beer  week  . Drug use: No  . Sexual activity: Not on file  Other Topics Concern  . Not on file  Social History Narrative  . Not on file   Social Determinants of Health   Financial Resource Strain: Not on file  Food Insecurity: No Food Insecurity (01/06/2022)   Hunger Vital Sign   . Worried About Charity fundraiser in the Last Year: Never true   . Ran Out of Food in the Last Year: Never true  Transportation Needs: No Transportation Needs (01/06/2022)   PRAPARE - Transportation   . Lack of Transportation (Medical): No   . Lack of Transportation (Non-Medical): No  Physical Activity: Not on file  Stress: Not on file  Social Connections: Not on file   No family history on file. Allergies  Allergen Reactions  . Aspirin Hives   Current Outpatient Medications  Medication Sig Dispense Refill  . allopurinol (ZYLOPRIM) 100 MG tablet TAKE 1 TABLET BY MOUTH EVERY DAY 90 tablet 0  . amLODipine (NORVASC) 5 MG tablet TAKE 1 TABLET BY MOUTH TWICE DAILY 180 tablet 0  . amphetamine-dextroamphetamine (ADDERALL XR) 30 MG 24 hr capsule Take 1 capsule (30 mg total) by mouth every morning. 30 capsule 0  . FLUoxetine (PROZAC) 40 MG capsule Take 40 mg by mouth daily.    . hydrochlorothiazide (HYDRODIURIL) 25 MG tablet TAKE 1 TABLET BY MOUTH DAILY 90 tablet 0  . levothyroxine (SYNTHROID) 50 MCG tablet TAKE 1 TABLET(50 MCG) BY MOUTH DAILY BEFORE BREAKFAST 90 tablet 0  . loratadine (CLARITIN) 10 MG tablet Take 10 mg by mouth daily.    Marland Kitchen losartan (COZAAR) 100 MG tablet TAKE 1 TABLET BY MOUTH EVERY DAY  90 tablet 0  . omeprazole (PRILOSEC) 20 MG capsule TAKE 1 CAPSULE BY MOUTH DAILY 90 capsule 3  . propranolol ER (INDERAL LA) 80 MG 24 hr capsule TAKE 1 CAPSULE BY MOUTH DAILY BEFORE BREAKFAST 90 capsule 0   No current facility-administered medications for this visit.   No results found.  Review of Systems:   A ROS was performed including pertinent positives and negatives as documented in the HPI.  Physical Exam :   Constitutional: NAD and appears stated age Neurological: Alert and oriented Psych: Appropriate affect and cooperative There were no vitals taken for this visit.   Comprehensive Musculoskeletal Exam:    Musculoskeletal Exam    Inspection Right Left  Skin No atrophy or winging No atrophy or winging  Palpation    Tenderness None Glenohumeral, lateral deltoid  Range of Motion    Flexion (passive) 170 170  Flexion (active) 170 170  Abduction 170 170  ER at the side 70 70  Can reach behind back to T12 T12  Strength     5/5 5/5, negative belly press  Special Tests    Pseudoparalytic No No  Neurologic    Fires PIN, radial, median, ulnar, musculocutaneous, axillary, suprascapular, long thoracic, and spinal accessory innervated muscles. No abnormal sensibility  Vascular/Lymphatic    Radial Pulse 2+ 2+  Cervical Exam    Patient has symmetric cervical range of motion with negative Spurling's test.  Special Test: Positive Neer and agement with negative speeds test     Imaging:   Xray (3 views left shoulder): Normal  Left shoulder MRI: Full-thickness supraspinatus tear with minimal retraction.  There is healthy appearing muscle belly on the T1 sagittal view  I personally reviewed and interpreted the radiographs.   Assessment:   62 y.o. male right-hand-dominant male presents with left shoulder pain consistent with left shoulder acute full-thickness rotator cuff tear.  I did discuss treatment options.  He has been undergoing physical therapy with some improvement  although this has been limited.  Unfortunately we did discuss the possibility of progression of rotator cuff tear.  I do believe this is been adding to a component of instability of the left shoulder as well.  After discussion of treatment options he would like to undergo left shoulder arthroscopy with rotator cuff repair.  I did discuss the specific rehab and limitations associated with this.  Plan :    -Plan for left shoulder arthroscopy with rotator cuff repair    Procedure Note  Patient:  Patrick [name]             Date of Birth: 17-Sep-1960           MRN: [id number]             Visit Date: 04/02/2022  Procedures: Visit Diagnoses: No diagnosis found.  Large Joint Inj: L subacromial bursa on 04/02/2022 3:27 PM Indications: pain Details: 22 G 1.5 in needle, ultrasound-guided anterior approach  Arthrogram: No  Medications: 4 mL lidocaine 1 %; 80 mg triamcinolone acetonide 40 MG/ML Outcome: tolerated well, no immediate complications Procedure, treatment alternatives, risks and benefits explained, specific risks discussed. Consent was given by the patient. Immediately prior to procedure a time out was called to verify the correct patient, procedure, equipment, support staff and site/side marked as required. Patient was prepped and draped in the usual sterile fashion.       After a lengthy discussion of treatment options, including risks, benefits, alternatives, complications of surgical and nonsurgical conservative options, the patient elected surgical repair.   The patient  is aware of the material risks  and complications including, but not limited to injury to adjacent structures, neurovascular injury, infection, numbness, bleeding, implant failure, thermal burns, stiffness, persistent pain, failure to heal, disease transmission from allograft, need for further surgery, dislocation, anesthetic risks, blood clots, risks of death,and others. The probabilities of surgical success and  failure discussed with patient given their particular co-morbidities.The time and nature of expected rehabilitation and recovery was discussed.The patient's questions were all answered preoperatively.  No barriers to understanding were noted. I explained the natural history of the disease process and Rx rationale.  I explained to the patient what I considered to be reasonable expectations given their personal situation.  The final treatment plan was arrived at through a shared patient decision making process model.      I personally saw and evaluated the patient, and participated in the management and treatment plan.  Vanetta Mulders, MD Attending Physician, Orthopedic Surgery  This document was dictated using Dragon voice recognition software. A reasonable attempt at proof reading has been made to minimize errors.

## 2022-05-27 ENCOUNTER — Other Ambulatory Visit: Payer: Self-pay | Admitting: Family Medicine

## 2022-06-03 ENCOUNTER — Other Ambulatory Visit: Payer: Self-pay | Admitting: Family Medicine

## 2022-06-04 ENCOUNTER — Other Ambulatory Visit: Payer: Self-pay

## 2022-06-04 ENCOUNTER — Encounter (HOSPITAL_BASED_OUTPATIENT_CLINIC_OR_DEPARTMENT_OTHER): Payer: Self-pay | Admitting: Orthopaedic Surgery

## 2022-06-09 ENCOUNTER — Telehealth: Payer: Self-pay

## 2022-06-09 ENCOUNTER — Other Ambulatory Visit: Payer: Self-pay | Admitting: Family Medicine

## 2022-06-09 NOTE — Telephone Encounter (Signed)
Your PA request has been approved. Additional information will be provided in the approval communication.  Authorization Expiration Date: 06/09/2023

## 2022-06-09 NOTE — Telephone Encounter (Signed)
Per pharmacy Omeprazole requires PA. Possibly due to qty limit. PA changed to indicate #30/30.  Key: [id number] PA started via [facility]

## 2022-06-11 ENCOUNTER — Ambulatory Visit (HOSPITAL_BASED_OUTPATIENT_CLINIC_OR_DEPARTMENT_OTHER): Payer: Self-pay | Admitting: Physical Therapy

## 2022-06-11 NOTE — Progress Notes (Signed)

## 2022-06-12 ENCOUNTER — Ambulatory Visit (HOSPITAL_BASED_OUTPATIENT_CLINIC_OR_DEPARTMENT_OTHER): Payer: 59 | Admitting: Physical Therapy

## 2022-06-14 NOTE — Anesthesia Preprocedure Evaluation (Signed)
Anesthesia Evaluation  Patient identified by MRN, date of birth, ID band Patient awake    Reviewed: Allergy & Precautions, NPO status , Patient's Chart, lab work & pertinent test results, reviewed documented beta blocker date and time   History of Anesthesia Complications Negative for: history of anesthetic complications  Airway Mallampati: II  TM Distance: >3 FB Neck ROM: Full    Dental  (+) Dental Advisory Given   Pulmonary former smoker   Pulmonary exam normal        Cardiovascular hypertension, Pt. on home beta blockers and Pt. on medications Normal cardiovascular exam     Neuro/Psych  PSYCHIATRIC DISORDERS Anxiety Depression    negative neurological ROS     GI/Hepatic Neg liver ROS,GERD  Medicated and Controlled,,  Endo/Other  Hypothyroidism    Renal/GU negative Renal ROS     Musculoskeletal  (+) Arthritis ,   Gout    Abdominal   Peds  (+) ADHD Hematology negative hematology ROS (+)   Anesthesia Other Findings   Reproductive/Obstetrics                             Anesthesia Physical Anesthesia Plan  ASA: 2  Anesthesia Plan: General   Post-op Pain Management: Regional block* and Tylenol PO (pre-op)*   Induction: Intravenous  PONV Risk Score and Plan: 2 and Treatment may vary due to age or medical condition, Ondansetron, Dexamethasone and Midazolam  Airway Management Planned: Oral ETT  Additional Equipment: None  Intra-op Plan:   Post-operative Plan: Extubation in OR  Informed Consent: I have reviewed the patients History and Physical, chart, labs and discussed the procedure including the risks, benefits and alternatives for the proposed anesthesia with the patient or authorized representative who has indicated his/her understanding and acceptance.     Dental advisory given  Plan Discussed with: CRNA and Anesthesiologist  Anesthesia Plan Comments:         Anesthesia Quick Evaluation

## 2022-06-15 ENCOUNTER — Ambulatory Visit (HOSPITAL_BASED_OUTPATIENT_CLINIC_OR_DEPARTMENT_OTHER): Payer: 59 | Admitting: Anesthesiology

## 2022-06-15 ENCOUNTER — Ambulatory Visit (HOSPITAL_BASED_OUTPATIENT_CLINIC_OR_DEPARTMENT_OTHER)
Admission: RE | Admit: 2022-06-15 | Discharge: 2022-06-15 | Disposition: A | Discharge status: Home / Self Care | Payer: 59 | Attending: Orthopaedic Surgery | Admitting: Orthopaedic Surgery

## 2022-06-15 ENCOUNTER — Encounter (HOSPITAL_BASED_OUTPATIENT_CLINIC_OR_DEPARTMENT_OTHER)
Admission: RE | Disposition: A | Discharge status: Home / Self Care | Payer: Self-pay | Source: Home / Self Care | Attending: Orthopaedic Surgery

## 2022-06-15 ENCOUNTER — Encounter (HOSPITAL_BASED_OUTPATIENT_CLINIC_OR_DEPARTMENT_OTHER): Payer: Self-pay | Admitting: Orthopaedic Surgery

## 2022-06-15 DIAGNOSIS — Z79890 Hormone replacement therapy (postmenopausal): Secondary | ICD-10-CM | POA: Diagnosis not present

## 2022-06-15 DIAGNOSIS — M5136 Other intervertebral disc degeneration, lumbar region: Secondary | ICD-10-CM | POA: Insufficient documentation

## 2022-06-15 DIAGNOSIS — M6520 Calcific tendinitis, unspecified site: Secondary | ICD-10-CM

## 2022-06-15 DIAGNOSIS — M75102 Unspecified rotator cuff tear or rupture of left shoulder, not specified as traumatic: Secondary | ICD-10-CM

## 2022-06-15 DIAGNOSIS — M7522 Bicipital tendinitis, left shoulder: Secondary | ICD-10-CM

## 2022-06-15 DIAGNOSIS — Z87891 Personal history of nicotine dependence: Secondary | ICD-10-CM | POA: Diagnosis not present

## 2022-06-15 DIAGNOSIS — S46012A Strain of muscle(s) and tendon(s) of the rotator cuff of left shoulder, initial encounter: Secondary | ICD-10-CM

## 2022-06-15 DIAGNOSIS — I10 Essential (primary) hypertension: Secondary | ICD-10-CM | POA: Insufficient documentation

## 2022-06-15 DIAGNOSIS — Z01818 Encounter for other preprocedural examination: Secondary | ICD-10-CM

## 2022-06-15 DIAGNOSIS — Z79899 Other long term (current) drug therapy: Secondary | ICD-10-CM | POA: Diagnosis not present

## 2022-06-15 DIAGNOSIS — F419 Anxiety disorder, unspecified: Secondary | ICD-10-CM | POA: Insufficient documentation

## 2022-06-15 DIAGNOSIS — M65222 Calcific tendinitis, left upper arm: Secondary | ICD-10-CM

## 2022-06-15 DIAGNOSIS — K219 Gastro-esophageal reflux disease without esophagitis: Secondary | ICD-10-CM | POA: Diagnosis not present

## 2022-06-15 DIAGNOSIS — E039 Hypothyroidism, unspecified: Secondary | ICD-10-CM | POA: Diagnosis not present

## 2022-06-15 HISTORY — PX: SHOULDER ARTHROSCOPY WITH ROTATOR CUFF REPAIR: SHX5685

## 2022-06-15 SURGERY — ARTHROSCOPY, SHOULDER, WITH ROTATOR CUFF REPAIR
Anesthesia: General | Site: Shoulder | Laterality: Left

## 2022-06-15 MED ORDER — MIDAZOLAM HCL 2 MG/2ML IJ SOLN
INTRAMUSCULAR | Status: AC
  Filled 2022-06-15: qty 2

## 2022-06-15 MED ORDER — EPHEDRINE 5 MG/ML INJ
INTRAVENOUS | Status: AC
  Filled 2022-06-15: qty 5

## 2022-06-15 MED ORDER — MIDAZOLAM HCL 2 MG/2ML IJ SOLN
1.0000 mg | Freq: Once | INTRAMUSCULAR | Status: AC
  Administered 2022-06-15: 1 mg via INTRAVENOUS

## 2022-06-15 MED ORDER — PHENYLEPHRINE HCL (PRESSORS) 10 MG/ML IV SOLN
INTRAVENOUS | Status: AC
  Filled 2022-06-15: qty 1

## 2022-06-15 MED ORDER — ACETAMINOPHEN 500 MG PO TABS
1000.0000 mg | ORAL_TABLET | Freq: Once | ORAL | Status: AC
  Administered 2022-06-15: 1000 mg via ORAL

## 2022-06-15 MED ORDER — VASOPRESSIN 20 UNIT/ML IV SOLN
INTRAVENOUS | Status: DC | PRN
  Administered 2022-06-15 (×4): 2 [IU] via INTRAVENOUS
  Administered 2022-06-15 (×2): 1 [IU] via INTRAVENOUS
  Administered 2022-06-15: 2 [IU] via INTRAVENOUS

## 2022-06-15 MED ORDER — PROPOFOL 10 MG/ML IV BOLUS
INTRAVENOUS | Status: DC | PRN
  Administered 2022-06-15: 160 mg via INTRAVENOUS

## 2022-06-15 MED ORDER — DEXAMETHASONE SODIUM PHOSPHATE 4 MG/ML IJ SOLN
INTRAMUSCULAR | Status: DC | PRN
  Administered 2022-06-15: 5 mg via INTRAVENOUS

## 2022-06-15 MED ORDER — ONDANSETRON HCL 4 MG/2ML IJ SOLN
INTRAMUSCULAR | Status: AC
  Filled 2022-06-15: qty 2

## 2022-06-15 MED ORDER — ROCURONIUM BROMIDE 10 MG/ML (PF) SYRINGE
PREFILLED_SYRINGE | INTRAVENOUS | Status: AC
  Filled 2022-06-15: qty 10

## 2022-06-15 MED ORDER — VASOPRESSIN 20 UNIT/ML IV SOLN
INTRAVENOUS | Status: AC
  Filled 2022-06-15: qty 1

## 2022-06-15 MED ORDER — BUPIVACAINE LIPOSOME 1.3 % IJ SUSP
INTRAMUSCULAR | Status: AC
  Filled 2022-06-15: qty 10

## 2022-06-15 MED ORDER — DEXAMETHASONE SODIUM PHOSPHATE 10 MG/ML IJ SOLN
INTRAMUSCULAR | Status: AC
  Filled 2022-06-15: qty 1

## 2022-06-15 MED ORDER — PHENYLEPHRINE 80 MCG/ML (10ML) SYRINGE FOR IV PUSH (FOR BLOOD PRESSURE SUPPORT)
PREFILLED_SYRINGE | INTRAVENOUS | Status: AC
  Filled 2022-06-15: qty 10

## 2022-06-15 MED ORDER — BUPIVACAINE HCL (PF) 0.5 % IJ SOLN
INTRAMUSCULAR | Status: DC | PRN
  Administered 2022-06-15: 15 mL via PERINEURAL

## 2022-06-15 MED ORDER — ROCURONIUM BROMIDE 100 MG/10ML IV SOLN
INTRAVENOUS | Status: DC | PRN
  Administered 2022-06-15: 50 mg via INTRAVENOUS

## 2022-06-15 MED ORDER — PHENYLEPHRINE HCL (PRESSORS) 10 MG/ML IV SOLN
INTRAVENOUS | Status: DC | PRN
  Administered 2022-06-15 (×4): 120 ug via INTRAVENOUS

## 2022-06-15 MED ORDER — ALBUMIN HUMAN 5 % IV SOLN: INTRAVENOUS | Status: DC | PRN

## 2022-06-15 MED ORDER — GABAPENTIN 300 MG PO CAPS
300.0000 mg | ORAL_CAPSULE | Freq: Once | ORAL | Status: AC
  Administered 2022-06-15: 300 mg via ORAL

## 2022-06-15 MED ORDER — ATROPINE SULFATE 0.4 MG/ML IV SOLN
INTRAVENOUS | Status: DC | PRN
  Administered 2022-06-15 (×2): .2 mg via INTRAVENOUS

## 2022-06-15 MED ORDER — GABAPENTIN 300 MG PO CAPS
ORAL_CAPSULE | ORAL | Status: AC
  Filled 2022-06-15: qty 1

## 2022-06-15 MED ORDER — PROPOFOL 10 MG/ML IV BOLUS
INTRAVENOUS | Status: AC
  Filled 2022-06-15: qty 20

## 2022-06-15 MED ORDER — BUPIVACAINE LIPOSOME 1.3 % IJ SUSP
INTRAMUSCULAR | Status: DC | PRN
  Administered 2022-06-15: 10 mL via PERINEURAL

## 2022-06-15 MED ORDER — ACETAMINOPHEN 500 MG PO TABS: 1000.0000 mg | ORAL_TABLET | Freq: Once | ORAL | Status: AC

## 2022-06-15 MED ORDER — ATROPINE SULFATE 0.4 MG/ML IV SOLN
INTRAVENOUS | Status: AC
  Filled 2022-06-15: qty 1

## 2022-06-15 MED ORDER — ALBUMIN HUMAN 5 % IV SOLN
INTRAVENOUS | Status: AC
  Filled 2022-06-15: qty 250

## 2022-06-15 MED ORDER — ONDANSETRON HCL 4 MG/2ML IJ SOLN
INTRAMUSCULAR | Status: DC | PRN
  Administered 2022-06-15: 4 mg via INTRAVENOUS

## 2022-06-15 MED ORDER — TRANEXAMIC ACID-NACL 1000-0.7 MG/100ML-% IV SOLN
1000.0000 mg | INTRAVENOUS | Status: AC
  Administered 2022-06-15: 1000 mg via INTRAVENOUS

## 2022-06-15 MED ORDER — TRANEXAMIC ACID-NACL 1000-0.7 MG/100ML-% IV SOLN
INTRAVENOUS | Status: AC
  Filled 2022-06-15: qty 100

## 2022-06-15 MED ORDER — LIDOCAINE 2% (20 MG/ML) 5 ML SYRINGE
INTRAMUSCULAR | Status: AC
  Filled 2022-06-15: qty 5

## 2022-06-15 MED ORDER — EPHEDRINE SULFATE (PRESSORS) 50 MG/ML IJ SOLN
INTRAMUSCULAR | Status: DC | PRN
  Administered 2022-06-15: 10 mg via INTRAVENOUS
  Administered 2022-06-15: 15 mg via INTRAVENOUS

## 2022-06-15 MED ORDER — OXYCODONE HCL 5 MG PO TABS: 5.0000 mg | ORAL_TABLET | Freq: Once | ORAL | Status: DC | PRN

## 2022-06-15 MED ORDER — PHENYLEPHRINE HCL-NACL 20-0.9 MG/250ML-% IV SOLN
INTRAVENOUS | Status: DC | PRN
  Administered 2022-06-15: 60 ug/min via INTRAVENOUS

## 2022-06-15 MED ORDER — OXYCODONE HCL 5 MG/5ML PO SOLN: 5.0000 mg | Freq: Once | ORAL | Status: DC | PRN

## 2022-06-15 MED ORDER — FENTANYL CITRATE (PF) 100 MCG/2ML IJ SOLN
INTRAMUSCULAR | Status: AC
  Filled 2022-06-15: qty 2

## 2022-06-15 MED ORDER — LIDOCAINE HCL (CARDIAC) PF 100 MG/5ML IV SOSY
PREFILLED_SYRINGE | INTRAVENOUS | Status: DC | PRN
  Administered 2022-06-15: 60 mg via INTRAVENOUS

## 2022-06-15 MED ORDER — SODIUM CHLORIDE 0.9 % IR SOLN
Status: DC | PRN
  Administered 2022-06-15: 9000 mL

## 2022-06-15 MED ORDER — CEFAZOLIN SODIUM-DEXTROSE 2-4 GM/100ML-% IV SOLN
INTRAVENOUS | Status: AC
  Filled 2022-06-15: qty 100

## 2022-06-15 MED ORDER — SUGAMMADEX SODIUM 200 MG/2ML IV SOLN
INTRAVENOUS | Status: DC | PRN
  Administered 2022-06-15: 200 mg via INTRAVENOUS

## 2022-06-15 MED ORDER — CEFAZOLIN SODIUM-DEXTROSE 2-4 GM/100ML-% IV SOLN
2.0000 g | INTRAVENOUS | Status: AC
  Administered 2022-06-15: 2 g via INTRAVENOUS

## 2022-06-15 MED ORDER — PROMETHAZINE HCL 25 MG/ML IJ SOLN: 6.2500 mg | INTRAMUSCULAR | Status: DC | PRN

## 2022-06-15 MED ORDER — FENTANYL CITRATE (PF) 100 MCG/2ML IJ SOLN: 25.0000 ug | INTRAMUSCULAR | Status: DC | PRN

## 2022-06-15 MED ORDER — FENTANYL CITRATE (PF) 100 MCG/2ML IJ SOLN
50.0000 ug | Freq: Once | INTRAMUSCULAR | Status: AC
  Administered 2022-06-15: 50 ug via INTRAVENOUS

## 2022-06-15 MED ORDER — LACTATED RINGERS IV SOLN: INTRAVENOUS | Status: DC

## 2022-06-15 MED ORDER — ACETAMINOPHEN 500 MG PO TABS
ORAL_TABLET | ORAL | Status: AC
  Filled 2022-06-15: qty 2

## 2022-06-15 SURGICAL SUPPLY — 65 items
AID PSTN UNV HD RSTRNT DISP (MISCELLANEOUS) ×1
ANCH SUT 2.9 KNTLS QUATTRO (Anchor) ×2 IMPLANT
ANCHOR QUATTRO LINK KNTLS 2.9 (Anchor) IMPLANT
APL PRP STRL LF DISP 70% ISPRP (MISCELLANEOUS) ×1
BLADE EXCALIBUR 4.0X13 (MISCELLANEOUS) ×1 IMPLANT
BURR OVAL 8 FLU 4.0X13 (MISCELLANEOUS) IMPLANT
CANNULA 5.75X71 LONG (CANNULA) IMPLANT
CANNULA 7X7 TWIST-IN (CANNULA) IMPLANT
CANNULA PASSPORT 5 (CANNULA) IMPLANT
CANNULA PASSPORT BUTTON 10-40 (CANNULA) IMPLANT
CANNULA TWIST IN 8.25X7CM (CANNULA) IMPLANT
CHLORAPREP W/TINT 26 (MISCELLANEOUS) ×2 IMPLANT
COOLER ICEMAN CLASSIC (MISCELLANEOUS) ×1 IMPLANT
DRAPE IMP U-DRAPE 54X76 (DRAPES) ×1 IMPLANT
DRAPE INCISE IOBAN 66X45 STRL (DRAPES) ×1 IMPLANT
DRAPE SHOULDER BEACH CHAIR (DRAPES) ×1 IMPLANT
DRAPE U-SHAPE 47X51 STRL (DRAPES) ×2 IMPLANT
DW OUTFLOW CASSETTE/TUBE SET (MISCELLANEOUS) ×1 IMPLANT
GAUZE PAD ABD 8X10 STRL (GAUZE/BANDAGES/DRESSINGS) ×1 IMPLANT
GAUZE SPONGE 4X4 12PLY STRL (GAUZE/BANDAGES/DRESSINGS) ×1 IMPLANT
GAUZE XEROFORM 1X8 LF (GAUZE/BANDAGES/DRESSINGS) ×1 IMPLANT
GLOVE BIO SURGEON STRL SZ 6 (GLOVE) ×2 IMPLANT
GLOVE BIO SURGEON STRL SZ7.5 (GLOVE) ×1 IMPLANT
GLOVE BIOGEL PI IND STRL 6.5 (GLOVE) ×1 IMPLANT
GLOVE BIOGEL PI IND STRL 8 (GLOVE) ×1 IMPLANT
GOWN STRL REUS W/ TWL LRG LVL3 (GOWN DISPOSABLE) ×2 IMPLANT
GOWN STRL REUS W/ TWL XL LVL3 (GOWN DISPOSABLE) ×1 IMPLANT
GOWN STRL REUS W/TWL LRG LVL3 (GOWN DISPOSABLE) ×2
GOWN STRL REUS W/TWL XL LVL3 (GOWN DISPOSABLE) ×2 IMPLANT
KIT STABILIZATION SHOULDER (MISCELLANEOUS) ×1 IMPLANT
KIT STR SPEAR 1.8 FBRTK DISP (KITS) IMPLANT
LASSO 90 CVE QUICKPAS (DISPOSABLE) IMPLANT
LASSO CRESCENT QUICKPASS (SUTURE) IMPLANT
MANIFOLD NEPTUNE II (INSTRUMENTS) ×1 IMPLANT
NDL HD SCORPION MEGA LOADER (NEEDLE) IMPLANT
NDL SAFETY ECLIP 18X1.5 (MISCELLANEOUS) ×1 IMPLANT
NDL SUT PASSER RTC (NEEDLE) IMPLANT
NEEDLE SUT PASSER RTC (NEEDLE) ×1 IMPLANT
PACK ARTHROSCOPY DSU (CUSTOM PROCEDURE TRAY) ×1 IMPLANT
PACK BASIN DAY SURGERY FS (CUSTOM PROCEDURE TRAY) ×1 IMPLANT
PAD COLD SHLDR WRAP-ON (PAD) ×1 IMPLANT
PAD ORTHO SHOULDER 7X19 LRG (SOFTGOODS) IMPLANT
PORT APPOLLO RF 90DEGREE MULTI (SURGICAL WAND) ×1 IMPLANT
RESTRAINT HEAD UNIVERSAL NS (MISCELLANEOUS) ×1 IMPLANT
SHEET MEDIUM DRAPE 40X70 STRL (DRAPES) IMPLANT
SLEEVE SCD COMPRESS KNEE MED (STOCKING) ×1 IMPLANT
SUT BROADBAND MINI LOOP BLACK (SUTURE) ×1
SUT BROADBAND MINI LOOP BLUE (SUTURE) ×1
SUT ETHILON 3 0 PS 1 (SUTURE) ×1 IMPLANT
SUT FIBERWIRE #2 38 T-5 BLUE (SUTURE)
SUT PDS AB 1 CT  36 (SUTURE)
SUT PDS AB 1 CT 36 (SUTURE) IMPLANT
SUT TIGER TAPE 7 IN WHITE (SUTURE) IMPLANT
SUTURE BROADBAND MINI LP BLACK (SUTURE) IMPLANT
SUTURE BROADBAND MINI LP BLUE (SUTURE) IMPLANT
SUTURE FIBERWR #2 38 T-5 BLUE (SUTURE) IMPLANT
SUTURE TAPE 1.3 40 TPR END (SUTURE) IMPLANT
SUTURE TAPE TIGERLINK 1.3MM BL (SUTURE) IMPLANT
SUTURETAPE 1.3 40 TPR END (SUTURE)
SUTURETAPE TIGERLINK 1.3MM BL (SUTURE)
SYR 5ML LL (SYRINGE) ×1 IMPLANT
TAPE FIBER 2MM 7IN #2 BLUE (SUTURE) IMPLANT
TOWEL GREEN STERILE FF (TOWEL DISPOSABLE) ×2 IMPLANT
TUBE CONNECTING 20X1/4 (TUBING) ×1 IMPLANT
TUBING ARTHROSCOPY IRRIG 16FT (MISCELLANEOUS) ×1 IMPLANT

## 2022-06-15 NOTE — Interval H&P Note (Signed)
History and Physical Interval Note:  06/15/2022 7:38 AM  Patrick [name]  has presented today for surgery, with the diagnosis of LEFT GLENOHUMERAL LABRAL TEAR.  The various methods of treatment have been discussed with the patient and family. After consideration of risks, benefits and other options for treatment, the patient has consented to  Procedure(s): LEFT SHOULDER ARTHROSCOPY WITH ROTATOR CUFF REPAIR (Left) , possible biceps tenodesis as a surgical intervention.  The patient's history has been reviewed, patient examined, no change in status, stable for surgery.  I have reviewed the patient's chart and labs.  Questions were answered to the patient's satisfaction.     Huel Cote

## 2022-06-15 NOTE — Progress Notes (Signed)
Assisted Dr. Brock with left, interscalene , ultrasound guided block. Side rails up, monitors on throughout procedure. See vital signs in flow sheet. Tolerated Procedure well. 

## 2022-06-15 NOTE — Anesthesia Procedure Notes (Signed)
Procedure Name: Intubation Date/Time: 06/15/2022 9:29 AM  Performed by: Cleda Clarks, CRNAPre-anesthesia Checklist: Patient identified, Emergency Drugs available, Suction available and Patient being monitored Patient Re-evaluated:Patient Re-evaluated prior to induction Oxygen Delivery Method: Circle system utilized Preoxygenation: Pre-oxygenation with 100% oxygen Induction Type: IV induction Ventilation: Mask ventilation without difficulty Laryngoscope Size: Miller and 2 Grade View: Grade II Tube type: Oral Tube size: 7.0 mm Number of attempts: 1 Airway Equipment and Method: Stylet Placement Confirmation: ETT inserted through vocal cords under direct vision, positive ETCO2 and breath sounds checked- equal and bilateral Secured at: 22 cm Tube secured with: Tape Dental Injury: Teeth and Oropharynx as per pre-operative assessment

## 2022-06-15 NOTE — Op Note (Signed)
Date of Surgery: 06/15/2022  INDICATIONS: Mr. Scoma is a 62 y.o.-year-old male with left shoulder pain in the setting if impingement type symptoms and MRI showing rotator cuff tear.  The risk and benefits of the procedure were discussed in detail and documented in the pre-operative evaluation.   PREOPERATIVE DIAGNOSIS: 1. Left shoulder rotator cuff tear Left shoulder biceps tendinitis  POSTOPERATIVE DIAGNOSIS: 1. Left shoulder calcific rotator cuff tendinitis  PROCEDURE: Arthroscopic limited debridement - 29822 Arthroscopic extensive debridement - 29823 Subdeltoid Bursa, Supraspinatus Tendon, Anterior Labrum, and Superior Labrum Arthroscopic subacromial decompression - 16109 Arthroscopic biceps tenodesis - 60454  SURGEON: Benancio Deeds MD  ASSISTANT: Kerby Less, ATC  ANESTHESIA:  general plus interscalene nerve block  IV FLUIDS AND URINE: See anesthesia record.  ANTIBIOTICS: Ancef  ESTIMATED BLOOD LOSS: 10 mL.  IMPLANTS:  Implant Name Type Inv. Item Serial No. Manufacturer Lot No. LRB No. Used Action  ANCHOR QUATTRO LINK KNTLS 2.9 - [id number] Anchor ANCHOR QUATTRO LINK KNTLS 2.9  [employer](ORTH[employer]TRAU[employer]SG) [id number] Left 1 Implanted  ANCHOR QUATTRO LINK KNTLS 2.9 - [id number] Anchor ANCHOR QUATTRO LINK KNTLS 2.9  [employer](ORTH,TRAU,BIO,SG) [id number] Left 1 Implanted    DRAINS: None  CULTURES: None  COMPLICATIONS: none  PROCEDURE:    OPERATIVE FINDING: Exam under anesthesia:   Examination under anesthesia revealed forward elevation of 150 degrees.  With the arm at the side, there was 65 degrees of external rotation.  There is a 1+ anterior load shift and a 1+ posterior load shift.    Arthroscopic findings demonstrated: Articular space: mild grade 2 chondral lesion anterior humerus Chondral surfaces: Normal glenoid Biceps: Significant redness, injection and insertion Subscapularis: Intact Supraspinatus: mild undersurface tearing Infraspinatus:  intact    I identified the patient in the pre-operative holding area.  I marked the operative right shoulder with my initials. I reviewed the risks and benefits of the proposed surgical intervention and the patient wished to proceed.  Anesthesia was then performed with regional block.  The patient was transferred to the operative suite and placed in the beach chair position with all bony prominences padded.     SCDs were placed on bilateral lower extremity. Appropriate antibiotics was administered within 1 hour before incision.  Anesthesia was induced.  The operative extremity was then prepped and draped in standard fashion. A time out was performed confirming the correct extremity, correct patient and correct procedure.   The arthroscope was introduced in the glenohumeral joint from a posterior portal.  An anterior portal was created.  The shoulder was examined and the above findings were noted.     With an arthroscopic shaver and a wand ablator, synovitis throughout the  shoulder was resected.  The arthroscopic shaver was used to excise torn portions of the labrum back to a stable margin. Specifically this was done for the anterior superior labrum.   At this time the biceps was tagged with a self passing device with a #2 nonabsorbable suture.  This was done through the anterior portal.   This was done a total of 2 times with the miniature broadband loop in a ripstop fashion.  This was then brought into an anchor at the anatomic insertion right above the subscapularis at the lesser tuberosity.  This was tapped and then anchor was placed with the sutures tension.  The undersurface of the supraspinatus was debrided.    The rotator cuff was then approached through the subacromial space. Anterior, lateral, and posterior portals were used.  Bursectomy was performed with  an arthroscopic shaver and ArthroCare wand.  There is a large focus of calcific tendinitis that was debrided and subsequently the wand was  used to perform a soft tissue acromioplasty with a shaver also on the undersurface of the acromion.   The shoulder was irrigated.  The arthroscopic instruments were removed.  Wounds were closed with 3-0 nylon sutures.  A sterile dressing was applied with xeroform, 4x8s, abdominal pad, and tape. An Flonnie Hailstone was placed and the upper extremity was placed in a shoulder immobilizer.  The patient tolerated the procedure well and was taken to the recovery room in stable condition.  All counts were correct in the case. The patient tolerated the procedure well and was taken to the recovery room in stable condition.    POSTOPERATIVE PLAN: He will follow the biceps repair protocol.  He will be active range of motion overhead as tolerated.  I will plan to see him back in 2 weeks for suture removal.     Benancio Deeds, MD 10:51 AM

## 2022-06-15 NOTE — Anesthesia Procedure Notes (Signed)
Anesthesia Regional Block: Interscalene brachial plexus block   Pre-Anesthetic Checklist: , timeout performed,  Correct Patient, Correct Site, Correct Laterality,  Correct Procedure, Correct Position, site marked,  Risks and benefits discussed,  Surgical consent,  Pre-op evaluation,  At surgeon's request and post-op pain management  Laterality: Left  Prep: chloraprep       Needles:  Injection technique: Single-shot  Needle Type: Echogenic Needle     Needle Length: 5cm  Needle Gauge: 21     Additional Needles:   Narrative:  Start time: 06/15/2022 8:39 AM End time: 06/15/2022 8:42 AM Injection made incrementally with aspirations every 5 mL.  Performed by: Personally  Anesthesiologist: Beryle Lathe, MD  Additional Notes: No pain on injection. No increased resistance to injection. Injection made in 5cc increments. Good needle visualization. Patient tolerated the procedure well.

## 2022-06-15 NOTE — Anesthesia Postprocedure Evaluation (Signed)
Anesthesia Post Note  Patient: Patrick [name]  Procedure(s) Performed: LEFT SHOULDER ARTHROSCOPY WITH ROTATOR CUFF REPAIR (Left: Shoulder)     Patient location during evaluation: PACU Anesthesia Type: General Level of consciousness: awake and alert Pain management: pain level controlled Vital Signs Assessment: post-procedure vital signs reviewed and stable Respiratory status: spontaneous breathing, nonlabored ventilation and respiratory function stable Cardiovascular status: stable, blood pressure returned to baseline and bradycardic Anesthetic complications: no   No notable events documented.  Last Vitals:  Vitals:   06/15/22 1136 06/15/22 1150  BP: 104/75 94/67  Pulse: (!) 56 (!) 56  Resp: 13 16  Temp:  (!) 36.1 C  SpO2: 92% 92%    Last Pain:  Vitals:   06/15/22 1150  TempSrc:   PainSc: 0-No pain                 Beryle Lathe

## 2022-06-15 NOTE — H&P (Signed)
Chief Complaint: Left shoulder pain        History of Present Illness:    03/26/2022: Presents today for follow-up of his left shoulder.  He is still experiencing pain with overhead activity as well as weakness.  Overall he is hoping to get an injection in the left shoulder as he is hoping to get some relief prior to a trip to [place]   Patrick [name] is a 62 y.o. male right-hand-dominant male presents with left shoulder pain after an injury on January 10, 2022 where he felt a pop in the shoulder that he had a cookie sheet.  Since that time the shoulder has felt unstable and feels like it is giving out with persistent pain.  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  He is trialed anti-inflammatories with limited relief.  He does enjoy being active in the spare time.  He is here today for further assessment.       Surgical History:   none   PMH/PSH/Family History/Social History/Meds/Allergies:         Past Medical History:  Diagnosis Date   ADHD (attention deficit hyperactivity disorder), inattentive type      sees Dr. Evelene Croon    Anxiety      sees Dr. Evelene Croon    Arthritis     DDD (degenerative disc disease), lumbar     Depression      sees Dr. Evelene Croon    Environmental allergies     GERD (gastroesophageal reflux disease)     History of blood transfusion     Hypertension     Hypothyroidism      partial thyroidectomy for benign nodule          Past Surgical History:  Procedure Laterality Date   BACK SURGERY Right 1985    microdiscectomy   COLONOSCOPY   2016    per Dr. Dulce Sellar, an unknown type of polyp was removed. repeat in 5 yrs    THYROIDECTOMY, PARTIAL   1998   TOTAL HIP ARTHROPLASTY Left 05/02/2012    Procedure: LEFT TOTAL HIP ARTHROPLASTY ANTERIOR APPROACH;  Surgeon: Shelda Pal, MD;  Location: [facility];  Service: Orthopedics;  Laterality: Left;    Social History         Socioeconomic History   Marital status: Married       Spouse name: Not on file   Number of children: Not on file   Years of education: Not on file   Highest education level: Not on file  Occupational History   Not on file  Tobacco Use   Smoking status: Former      Types: Cigarettes      Quit date: 04/27/2002      Years since quitting: 19.9   Smokeless tobacco: Former      Quit date: 04/27/2002  Substance and Sexual Activity   Alcohol use: Yes      Comment: 3-4 glasses wine/beer  week   Drug use: No   Sexual activity: Not on file  Other Topics Concern   Not on file  Social History Narrative   Not on file    Social Determinants of Health        Financial Resource Strain: Not on file  Food Insecurity: No Food Insecurity (01/06/2022)    Hunger Vital Sign     Worried About Running Out of Food in the Last Year: Never true     Ran Out of Food in  the Last Year: Never true  Transportation Needs: No Transportation Needs (01/06/2022)    PRAPARE - Therapist, art (Medical): No     Lack of Transportation (Non-Medical): No  Physical Activity: Not on file  Stress: Not on file  Social Connections: Not on file    No family history on file.     Allergies  Allergen Reactions   Aspirin Hives          Current Outpatient Medications  Medication Sig Dispense Refill   allopurinol (ZYLOPRIM) 100 MG tablet TAKE 1 TABLET BY MOUTH EVERY DAY 90 tablet 0   amLODipine (NORVASC) 5 MG tablet TAKE 1 TABLET BY MOUTH TWICE DAILY 180 tablet 0   amphetamine-dextroamphetamine (ADDERALL XR) 30 MG 24 hr capsule Take 1 capsule (30 mg total) by mouth every morning. 30 capsule 0   FLUoxetine (PROZAC) 40 MG capsule Take 40 mg by mouth daily.       hydrochlorothiazide (HYDRODIURIL) 25 MG tablet TAKE 1 TABLET BY MOUTH DAILY 90 tablet 0   levothyroxine (SYNTHROID) 50 MCG tablet TAKE 1 TABLET(50 MCG) BY MOUTH DAILY BEFORE BREAKFAST 90 tablet 0   loratadine (CLARITIN) 10 MG tablet Take 10 mg by mouth daily.       losartan (COZAAR) 100 MG  tablet TAKE 1 TABLET BY MOUTH EVERY DAY 90 tablet 0   omeprazole (PRILOSEC) 20 MG capsule TAKE 1 CAPSULE BY MOUTH DAILY 90 capsule 3   propranolol ER (INDERAL LA) 80 MG 24 hr capsule TAKE 1 CAPSULE BY MOUTH DAILY BEFORE BREAKFAST 90 capsule 0    No current facility-administered medications for this visit.    Imaging Results (Last 48 hours)  No results found.     Review of Systems:   A ROS was performed including pertinent positives and negatives as documented in the HPI.   Physical Exam :   Constitutional: NAD and appears stated age Neurological: Alert and oriented Psych: Appropriate affect and cooperative There were no vitals taken for this visit.    Comprehensive Musculoskeletal Exam:     Musculoskeletal Exam      Inspection Right Left  Skin No atrophy or winging No atrophy or winging  Palpation      Tenderness None Glenohumeral, lateral deltoid  Range of Motion      Flexion (passive) 170 170  Flexion (active) 170 170  Abduction 170 170  ER at the side 70 70  Can reach behind back to T12 T12  Strength        5/5 5/5, negative belly press  Special Tests      Pseudoparalytic No No  Neurologic      Fires PIN, radial, median, ulnar, musculocutaneous, axillary, suprascapular, long thoracic, and spinal accessory innervated muscles. No abnormal sensibility  Vascular/Lymphatic      Radial Pulse 2+ 2+  Cervical Exam      Patient has symmetric cervical range of motion with negative Spurling's test.  Special Test: Positive Neer and agement with negative speeds test        Imaging:   Xray (3 views left shoulder): Normal   Left shoulder MRI: Full-thickness supraspinatus tear with minimal retraction.  There is healthy appearing muscle belly on the T1 sagittal view   I personally reviewed and interpreted the radiographs.     Assessment:   62 y.o. male right-hand-dominant male presents with left shoulder pain consistent with left shoulder acute full-thickness rotator cuff  tear.  I did discuss treatment options.  He  has been undergoing physical therapy with some improvement although this has been limited.  Unfortunately we did discuss the possibility of progression of rotator cuff tear.  I do believe this is been adding to a component of instability of the left shoulder as well.  After discussion of treatment options he would like to undergo left shoulder arthroscopy with rotator cuff repair.  I did discuss the specific rehab and limitations associated with this.   Plan :     -Plan for left shoulder arthroscopy with rotator cuff repair, possible biceps tendodesis    After a lengthy discussion of treatment options, including risks, benefits, alternatives, complications of surgical and nonsurgical conservative options, the patient elected surgical repair.    The patient  is aware of the material risks  and complications including, but not limited to injury to adjacent structures, neurovascular injury, infection, numbness, bleeding, implant failure, thermal burns, stiffness, persistent pain, failure to heal, disease transmission from allograft, need for further surgery, dislocation, anesthetic risks, blood clots, risks of death,and others. The probabilities of surgical success and failure discussed with patient given their particular co-morbidities.The time and nature of expected rehabilitation and recovery was discussed.The patient's questions were all answered preoperatively.  No barriers to understanding were noted. I explained the natural history of the disease process and Rx rationale.  I explained to the patient what I considered to be reasonable expectations given their personal situation.  The final treatment plan was arrived at through a shared patient decision making process model.           I personally saw and evaluated the patient, and participated in the management and treatment plan.   Huel Cote, MD Attending Physician, Orthopedic Surgery   This  document was dictated using Dragon voice recognition software. A reasonable attempt at proof reading has been made to minimize errors.

## 2022-06-15 NOTE — Transfer of Care (Signed)
Immediate Anesthesia Transfer of Care Note  Patient: Patrick [name]  Procedure(s) Performed: LEFT SHOULDER ARTHROSCOPY WITH ROTATOR CUFF REPAIR (Left: Shoulder)  Patient Location: PACU  Anesthesia Type:General  Level of Consciousness: awake, alert , and oriented  Airway & Oxygen Therapy: Patient Spontanous Breathing and Patient connected to face mask oxygen  Post-op Assessment: Report given to RN and Post -op Vital signs reviewed and stable  Post vital signs: Reviewed and stable  Last Vitals:  Vitals Value Taken Time  BP 95/85 06/15/22 1048  Temp    Pulse 51 06/15/22 1052  Resp 14 06/15/22 1052  SpO2 91 % 06/15/22 1052  Vitals shown include unvalidated device data.  Last Pain:  Vitals:   06/15/22 0759  TempSrc: Oral  PainSc: 0-No pain      Patients Stated Pain Goal: 3 (06/15/22 0759)  Complications: No notable events documented.

## 2022-06-15 NOTE — Discharge Instructions (Addendum)
Discharge Instructions    Attending Surgeon: Huel Cote, MD Office Phone Number: [phone]   Diagnosis and Procedures:    Surgeries Performed: Left shoulder debridement with biceps tenodesis  Discharge Plan:    Diet: Resume usual diet. Begin with light or bland foods.  Drink plenty of fluids.  Activity:  Keep sling and dressing in place until your follow up visit in Physical Therapy You are advised to go home directly from the hospital or surgical center. Restrict your activities.  GENERAL INSTRUCTIONS: 1.  Keep your surgical site elevated above your heart for at least 5-7 days or longer to prevent swelling. This will improve your comfort and your overall recovery following surgery.     2. Please call Dr. Serena Croissant office at [phone] with questions Monday-Friday during business hours. If no one answers, please leave a message and someone should get back to the patient within 24 hours. For emergencies please call 911 or proceed to the emergency room.   3. Patient to notify surgical team if experiences any of the following: Bowel/Bladder dysfunction, uncontrolled pain, nerve/muscle weakness, incision with increased drainage or redness, nausea/vomiting and Fever greater than 101.0 F.  Be alert for signs of infection including redness, streaking, odor, fever or chills. Be alert for excessive pain or bleeding and notify your surgeon immediately.  WOUND INSTRUCTIONS:   Leave your dressing/cast/splint in place until your post operative visit.  Keep it clean and dry.  Always keep the incision clean and dry until the staples/sutures are removed. If there is no drainage from the incision you should keep it open to air. If there is drainage from the incision you must keep it covered at all times until the drainage stops  Do not soak in a bath tub, hot tub, pool, lake or other body of water until 21 days after your surgery and your incision is completely dry and healed.  If  you have removable sutures (or staples) they must be removed 10-14 days (unless otherwise instructed) from the day of your surgery.     1)  Elevate the extremity as much as possible.  2)  Keep the dressing clean and dry.  3)  Please call us if the dressing becomes wet or dirty.  4)  If you are experiencing worsening pain or worsening swelling, please call.     MEDICATIONS: Resume all previous home medications at the previous prescribed dose and frequency unless otherwise noted Start taking the  pain medications on an as-needed basis as prescribed  Please taper down pain medication over the next week following surgery.  Ideally you should not require a refill of any narcotic pain medication.  Take pain medication with food to minimize nausea. In addition to the prescribed pain medication, you may take over-the-counter pain relievers such as Tylenol.  Do NOT take additional tylenol if your pain medication already has tylenol in it.  Aspirin 325mg  daily for four weeks.      FOLLOWUP INSTRUCTIONS: 1. Follow up at the Physical Therapy Clinic 3-4 days following surgery. This appointment should be scheduled unless other arrangements have been made.The Physical Therapy scheduling number is [phone] if an appointment has not already been arranged.  2. Contact Dr. Serena Croissant office during office hours at [phone] or the practice after hours line at [phone] for non-emergencies. For medical emergencies call 911.   Discharge Location: Home  Post Anesthesia Home Care Instructions  Activity: Get plenty of rest for the remainder of the day. A  responsible individual must stay with you for 24 hours following the procedure.  For the next 24 hours, DO NOT: -Drive a car -Advertising copywriter -Drink alcoholic beverages -Take any medication unless instructed by your physician -Make any legal decisions or sign important papers.  Meals: Start with liquid foods such as gelatin or soup.  Progress to regular foods as tolerated. Avoid greasy, spicy, heavy foods. If nausea and/or vomiting occur, drink only clear liquids until the nausea and/or vomiting subsides. Call your physician if vomiting continues.  Special Instructions/Symptoms: Your throat may feel dry or sore from the anesthesia or the breathing tube placed in your throat during surgery. If this causes discomfort, gargle with warm salt water. The discomfort should disappear within 24 hours.  If you had a scopolamine patch placed behind your ear for the management of post- operative nausea and/or vomiting:  1. The medication in the patch is effective for 72 hours, after which it should be removed.  Wrap patch in a tissue and discard in the trash. Wash hands thoroughly with soap and water. 2. You may remove the patch earlier than 72 hours if you experience unpleasant side effects which may include dry mouth, dizziness or visual disturbances. 3. Avoid touching the patch. Wash your hands with soap and water after contact with the patch.   Regional Anesthesia Blocks  1. Numbness or the inability to move the "blocked" extremity may last from 3-48 hours after placement. The length of time depends on the medication injected and your individual response to the medication. If the numbness is not going away after 48 hours, call your surgeon.  2. The extremity that is blocked will need to be protected until the numbness is gone and the  Strength has returned. Because you cannot feel it, you will need to take extra care to avoid injury. Because it may be weak, you may have difficulty moving it or using it. You may not know what position it is in without looking at it while the block is in effect.  3. For blocks in the legs and feet, returning to weight bearing and walking needs to be done carefully. You will need to wait until the numbness is entirely gone and the strength has returned. You should be able to move your leg and foot  normally before you try and bear weight or walk. You will need someone to be with you when you first try to ensure you do not fall and possibly risk injury.  4. Bruising and tenderness at the needle site are common side effects and will resolve in a few days.  5. Persistent numbness or new problems with movement should be communicated to the surgeon or the [facility] [phone] [facility] [phone]).   Information for Discharge Teaching: EXPAREL (bupivacaine liposome injectable suspension)   Your surgeon or anesthesiologist gave you EXPAREL(bupivacaine) to help control your pain after surgery.  EXPAREL is a local anesthetic that provides pain relief by numbing the tissue around the surgical site. EXPAREL is designed to release pain medication over time and can control pain for up to 72 hours. Depending on how you respond to EXPAREL, you may require less pain medication during your recovery.  Possible side effects: Temporary loss of sensation or ability to move in the area where bupivacaine was injected. Nausea, vomiting, constipation Rarely, numbness and tingling in your mouth or lips, lightheadedness, or anxiety may occur. Call your doctor right away if you think you may be experiencing  any of these sensations, or if you have other questions regarding possible side effects.  Follow all other discharge instructions given to you by your surgeon or nurse. Eat a healthy diet and drink plenty of water or other fluids.  If you return to the hospital for any reason within 96 hours following the administration of EXPAREL, it is important for health care providers to know that you have received this anesthetic. A teal colored band has been placed on your arm with the date, time and amount of EXPAREL you have received in order to alert and inform your health care providers. Please leave this armband in place for the full 96 hours following administration, and then  you may remove the band.     Next dose of Tylenol may be given at 2:00pm if needed.

## 2022-06-15 NOTE — Brief Op Note (Signed)
   Brief Op Note  Date of Surgery: 06/15/2022  Preoperative Diagnosis: LEFT GLENOHUMERAL LABRAL TEAR  Postoperative Diagnosis: same  Procedure: Procedure(s): LEFT SHOULDER ARTHROSCOPY WITH ROTATOR CUFF REPAIR  Implants: Implant Name Type Inv. Item Serial No. Manufacturer Lot No. LRB No. Used Action  ANCHOR QUATTRO LINK KNTLS 2.9 - [id number] Anchor ANCHOR QUATTRO LINK KNTLS 2.9  [employer](ORTH,TRAU,BIO,SG) [id number] Left 1 Implanted  ANCHOR QUATTRO LINK KNTLS 2.9 - [id number] Anchor ANCHOR Luisa Hart KNTLS 2.9  [employer](ORTH,TRAU,BIO,SG) [id number] Left 1 Implanted    Surgeons: Surgeon(s): Huel Cote, MD  Anesthesia: General    Estimated Blood Loss: See anesthesia record  Complications: None  Condition to PACU: Stable  Benancio Deeds, MD 06/15/2022 10:48 AM

## 2022-06-16 ENCOUNTER — Encounter (HOSPITAL_BASED_OUTPATIENT_CLINIC_OR_DEPARTMENT_OTHER): Payer: Self-pay | Admitting: Orthopaedic Surgery

## 2022-06-18 ENCOUNTER — Other Ambulatory Visit: Payer: Self-pay

## 2022-06-18 ENCOUNTER — Encounter (HOSPITAL_BASED_OUTPATIENT_CLINIC_OR_DEPARTMENT_OTHER): Payer: Self-pay | Admitting: Physical Therapy

## 2022-06-18 ENCOUNTER — Ambulatory Visit (HOSPITAL_BASED_OUTPATIENT_CLINIC_OR_DEPARTMENT_OTHER): Payer: 59 | Attending: Orthopaedic Surgery | Admitting: Physical Therapy

## 2022-06-18 DIAGNOSIS — M6281 Muscle weakness (generalized): Secondary | ICD-10-CM | POA: Insufficient documentation

## 2022-06-18 DIAGNOSIS — S46012A Strain of muscle(s) and tendon(s) of the rotator cuff of left shoulder, initial encounter: Secondary | ICD-10-CM | POA: Diagnosis not present

## 2022-06-18 DIAGNOSIS — M25512 Pain in left shoulder: Secondary | ICD-10-CM | POA: Insufficient documentation

## 2022-06-18 NOTE — Therapy (Signed)
OUTPATIENT PHYSICAL THERAPY EVALUATION   Patient Name: Patrick [name] MRN: [id number] DOB:1960/08/10, 62 y.o., male Today's Date: 06/18/2022  END OF SESSION:  PT End of Session - 06/18/22 0936     Visit Number 1    Number of Visits 13    Date for PT Re-Evaluation 08/13/22    Authorization Type AETNA    PT Start Time 0930    PT Stop Time 1008    PT Time Calculation (min) 38 min    Activity Tolerance Patient tolerated treatment well    Behavior During Therapy WFL for tasks assessed/performed             Past Medical History:  Diagnosis Date   ADHD (attention deficit hyperactivity disorder), inattentive type    sees Dr. Evelene Croon    Anxiety    sees Dr. Evelene Croon    Arthritis    DDD (degenerative disc disease), lumbar    Depression    sees Dr. Evelene Croon    Environmental allergies    GERD (gastroesophageal reflux disease)    History of blood transfusion    Hypertension    Hypothyroidism    partial thyroidectomy for benign nodule    Past Surgical History:  Procedure Laterality Date   BACK SURGERY Right 1985   microdiscectomy   COLONOSCOPY  02/19/2020   per Dr. Dulce Sellar, precancerous polyp, repeat in 3 yrs   SHOULDER ARTHROSCOPY WITH ROTATOR CUFF REPAIR Left 06/15/2022   Procedure: LEFT SHOULDER ARTHROSCOPY WITH ROTATOR CUFF REPAIR;  Surgeon: Huel Cote, MD;  Location: [facility];  Service: Orthopedics;  Laterality: Left;   THYROIDECTOMY, PARTIAL  1998   TOTAL HIP ARTHROPLASTY Left 05/02/2012   Procedure: LEFT TOTAL HIP ARTHROPLASTY ANTERIOR APPROACH;  Surgeon: Shelda Pal, MD;  Location: [facility];  Service: Orthopedics;  Laterality: Left;   Patient Active Problem List   Diagnosis Date Noted   Calcific tendinitis 06/15/2022   Preoperative testing 12/26/2020   Chronic right hip pain 08/20/2020   BPH with urinary obstruction 10/17/2018   Gout 08/11/2016   Depression with anxiety 08/11/2016   HTN (hypertension) 08/11/2016   ADHD (attention deficit  hyperactivity disorder), inattentive type 08/11/2016   Hypothyroidism 08/11/2016   Environmental allergies 08/11/2016   GERD (gastroesophageal reflux disease) 08/11/2016   Overweight (BMI 25.0-29.9) 05/03/2012     REFERRING PROVIDER: Steward Drone, MD  REFERRING DIAG: S46.012A (ICD-10-CM) - Traumatic complete tear of left rotator cuff, initial encounter  S/p Lt biceps repair (cuff in tact per MD)  Rationale for Evaluation and Treatment: Rehabilitation  THERAPY DIAG:  Acute pain of left shoulder  Muscle weakness (generalized)  ONSET DATE: DOS 06/15/22   SUBJECTIVE:  SUBJECTIVE STATEMENT: Overall feeling really good. Reached into horizontal abduction and that was the final straw. I do have some discomfort in right shoulder. 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   PERTINENT HISTORY:  RC calcification, bil hip replacement, prior left knee injury  PAIN:  Are you having pain? No  PRECAUTIONS:  None  WEIGHT BEARING RESTRICTIONS:  Yes POSTOPERATIVE PLAN: He will follow the biceps repair protocol.  He will be active range of motion overhead as tolerated.  FALLS:  Has patient fallen in last 6 months? No   OCCUPATION:  It consultant  PLOF:  Independent  PATIENT GOALS:  Move around and use arm normally   OBJECTIVE:   PATIENT SURVEYS:  FOTO 41  COGNITIVE STATUS: Within functional limits for tasks assessed   RED FLAGS: None    SENSATION: WFL  POSTURE:  EVAL: mild forward head  HAND DOMINANCE:  Right    Body Part #1 Shoulder  PALPATION: No s/s of infection at eval  UE ROM UPPER EXTREMITY ROM:  EVAL- able to demo activation of all RC, flexion and abd to  45, full range available in left elbow   TREATMENT:                                                                                                                               DATE: 5/31 Bandages changed Scap retraction Upper trap & levator stretch Elbow AAROM, AROM, pendulum     PATIENT EDUCATION:  Education details: Anatomy of condition, POC, HEP, exercise form/rationale Person educated: Patient Education method: Explanation, Demonstration, Tactile cues, Verbal cues, and Handouts Education comprehension: verbalized understanding, returned demonstration, verbal cues required, tactile cues required, and needs further education  HOME EXERCISE PROGRAM: [id number]   ASSESSMENT:  CLINICAL IMPRESSION: Patient is a 62 y.o. M who was seen today for physical therapy evaluation and treatment for Lt shoulder SAD and biceps repair. Cuff was in tact and did not require repair. Pt clear to remove sling. Advised him to utilize it as needed for pain but to continue resting and allow scar tissue to form for the next few days, we will then progress as tolerate.     OBJECTIVE IMPAIRMENTS: decreased activity tolerance, decreased ROM, decreased strength, increased muscle spasms, impaired flexibility, impaired UE functional use, improper body mechanics, and pain.   ACTIVITY LIMITATIONS: carrying, lifting, dressing, reach over head, and hygiene/grooming  PARTICIPATION LIMITATIONS: meal prep, cleaning, laundry, driving, shopping, community activity, and occupation  PERSONAL FACTORS: Time since onset of injury/illness/exacerbation are also affecting patient's functional outcome.   REHAB POTENTIAL: Good  CLINICAL DECISION MAKING: Stable/uncomplicated  EVALUATION COMPLEXITY: Low   GOALS: Goals reviewed with patient? Yes  SHORT TERM GOALS: Target date: 6/22  GHJ flexion and abd to 90 against gravity with proper form Baseline: Goal status: INITIAL  2.  Completing HEP and ADLs pain  <=3/10 Baseline:  Goal status: INITIAL   LONG TERM GOALS: Target date: POC date  Meet FOTO goal Baseline:  Goal status: INITIAL  2.  Able to reach all overhead objects without limitation by pain Baseline:  Goal status: INITIAL  3.  Gross GHJ strength 5/5 Baseline:  Goal status: INITIAL  4.  Able to complete all aDLs without limitation by pain Baseline:  Goal status: INITIAL   PLAN:  PT FREQUENCY: 1-2x/week  PT DURATION: through POC date  PLANNED INTERVENTIONS: Therapeutic exercises, Therapeutic activity, Neuromuscular re-education, Patient/Family education, Self Care, Joint mobilization, Aquatic Therapy, Dry Needling, Electrical stimulation, Spinal mobilization, Cryotherapy, Moist heat, scar mobilization, Taping, Traction, Ionotophoresis 4mg /ml Dexamethasone, Manual therapy, and Re-evaluation.  PLAN FOR NEXT SESSION: AROM as tolerated   Tarvis Blossom C. Kamron Portee PT, DPT 06/18/22 11:46 AM

## 2022-06-19 IMAGING — XA DG FLUORO GUIDE NDL PLC/BX
2 series · 2 of 2 positions shown · non-contrast
Comparison: none

CLINICAL DATA: Chronic left thumb pain.

[Series 1: ortho standard · 1 of 1 slices shown (1 of 2)]
[im 1/1]
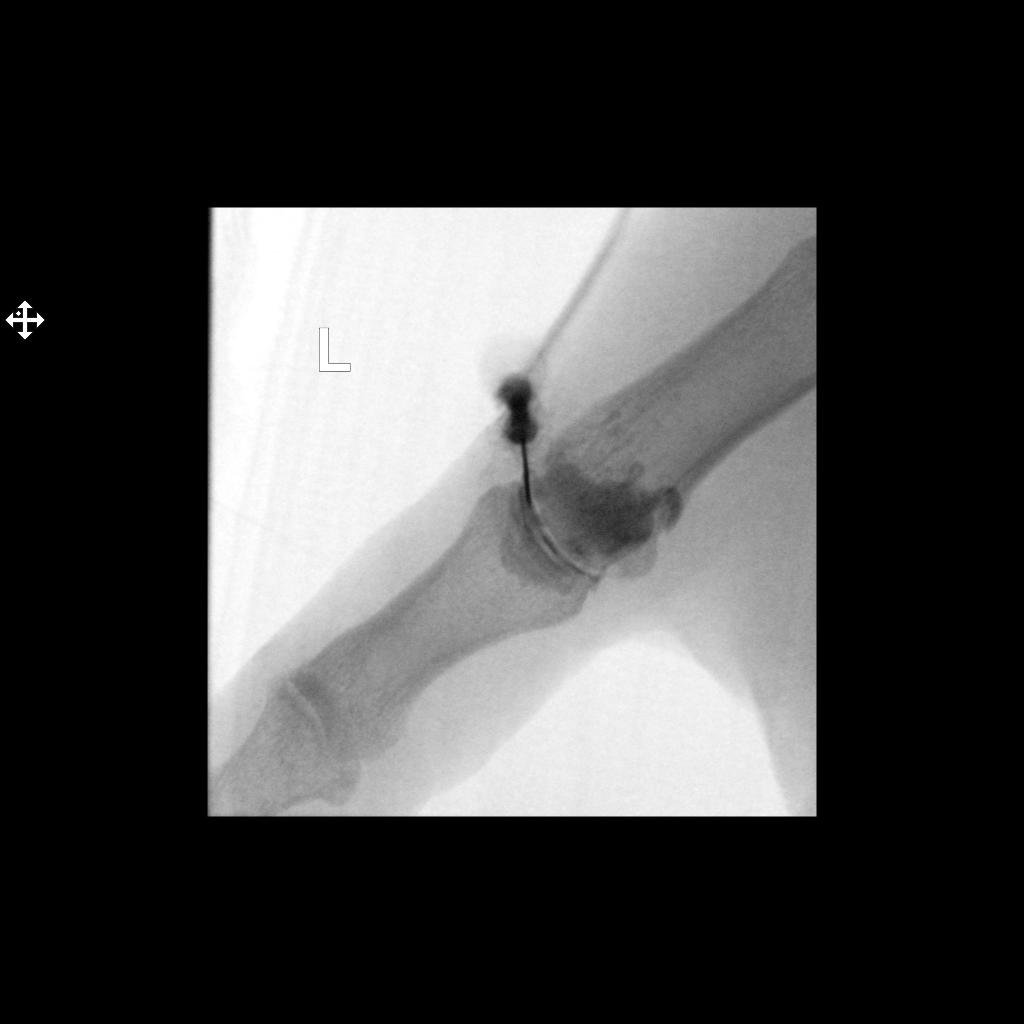

[Series 2: ortho standard · 1 of 1 slices shown (2 of 2)]
[im 1/1]
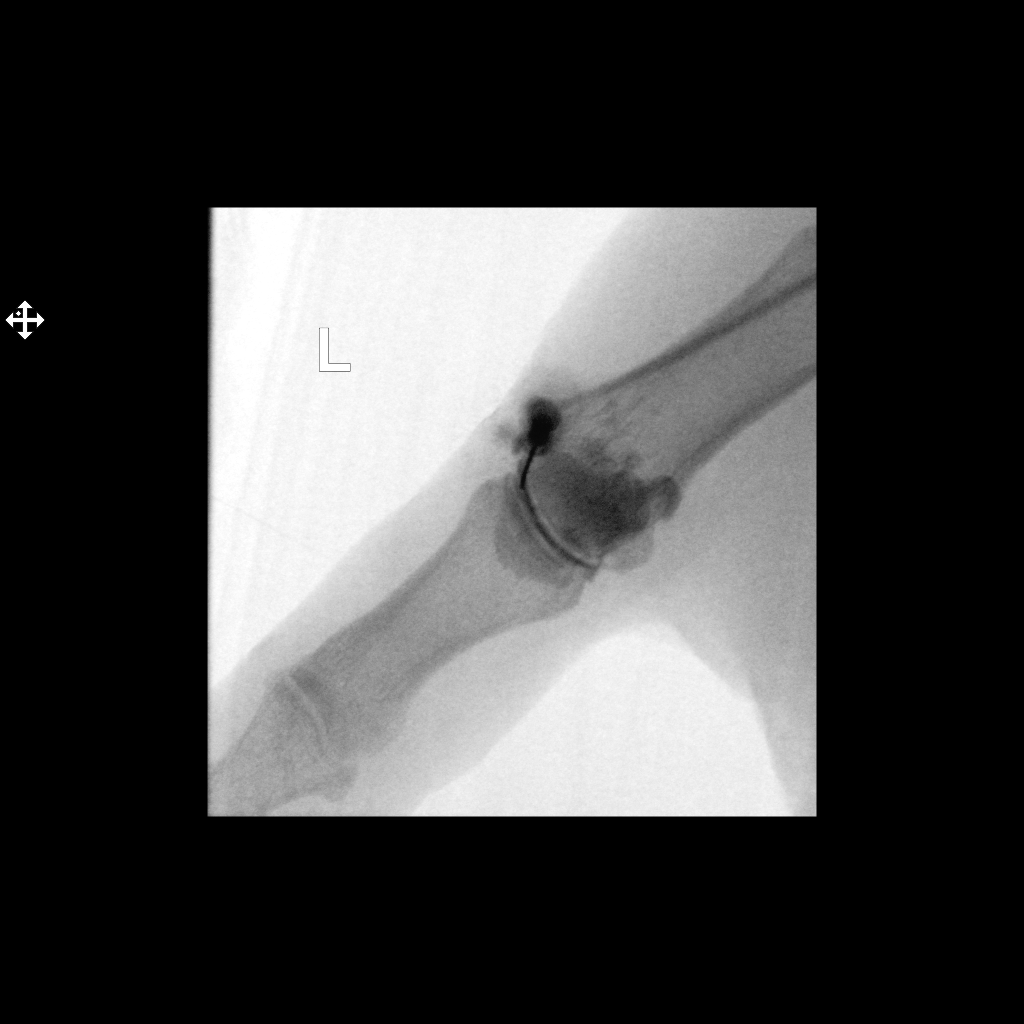

[2 of 2 positions shown; findings below may reference images not displayed]

FLUOROSCOPY TIME:  Radiation Exposure Index (as provided by the
fluoroscopic device): 0.1 mGy

Fluoroscopy Time:  13 seconds

Number of Acquired Images:  0

PROCEDURE:
The risks and benefits of the procedure were discussed with the
patient, and written informed consent was obtained. The patient
stated no history of allergy to contrast media. A formal timeout
procedure was performed with the patient according to departmental
protocol.

The patient was placed supine on the fluoroscopy table and the left
first MCP joint was identified under fluoroscopy. The skin overlying
the left first MCP joint was subsequently cleaned with Betadine and
a sterile drape was placed over the area of interest. 0.5 ml 1%
Lidocaine was used to anesthetize the skin around the needle
insertion site.

A 25 gauge needle was inserted into the left first MCP joint under
fluoroscopy.

1 ml of gadolinium mixture (0.1 ml of Multihance mixed with 15 ml of
Isovue-M 200 contrast and 5 ml of sterile saline) were injected into
the left first MCP joint.

The needle was removed and hemostasis was achieved. The patient was
subsequently transferred to MRI for imaging.
IMPRESSION: Technically successful left first MCP joint injection for MRI.

## 2022-06-23 ENCOUNTER — Encounter (HOSPITAL_BASED_OUTPATIENT_CLINIC_OR_DEPARTMENT_OTHER): Payer: 59 | Admitting: Orthopaedic Surgery

## 2022-06-23 ENCOUNTER — Encounter (HOSPITAL_BASED_OUTPATIENT_CLINIC_OR_DEPARTMENT_OTHER): Payer: Self-pay | Admitting: Physical Therapy

## 2022-06-23 ENCOUNTER — Ambulatory Visit (HOSPITAL_BASED_OUTPATIENT_CLINIC_OR_DEPARTMENT_OTHER): Payer: 59 | Attending: Orthopaedic Surgery | Admitting: Physical Therapy

## 2022-06-23 DIAGNOSIS — M6281 Muscle weakness (generalized): Secondary | ICD-10-CM | POA: Diagnosis present

## 2022-06-23 DIAGNOSIS — M25512 Pain in left shoulder: Secondary | ICD-10-CM | POA: Diagnosis present

## 2022-06-23 NOTE — Therapy (Signed)
OUTPATIENT PHYSICAL THERAPY TREATMENT   Patient Name: Patrick [name] MRN: [id number] DOB:1961-01-01, 62 y.o., male Today's Date: 06/23/2022  END OF SESSION:  PT End of Session - 06/23/22 0951     Visit Number 2    Number of Visits 13    Date for PT Re-Evaluation 08/13/22    Authorization Type AETNA    PT Start Time 0932    PT Stop Time 1010    PT Time Calculation (min) 38 min    Activity Tolerance Patient tolerated treatment well    Behavior During Therapy WFL for tasks assessed/performed              Past Medical History:  Diagnosis Date   ADHD (attention deficit hyperactivity disorder), inattentive type    sees Dr. Evelene Croon    Anxiety    sees Dr. Evelene Croon    Arthritis    DDD (degenerative disc disease), lumbar    Depression    sees Dr. Evelene Croon    Environmental allergies    GERD (gastroesophageal reflux disease)    History of blood transfusion    Hypertension    Hypothyroidism    partial thyroidectomy for benign nodule    Past Surgical History:  Procedure Laterality Date   BACK SURGERY Right 1985   microdiscectomy   COLONOSCOPY  02/19/2020   per Dr. Dulce Sellar, precancerous polyp, repeat in 3 yrs   SHOULDER ARTHROSCOPY WITH ROTATOR CUFF REPAIR Left 06/15/2022   Procedure: LEFT SHOULDER ARTHROSCOPY WITH ROTATOR CUFF REPAIR;  Surgeon: Huel Cote, MD;  Location: [facility];  Service: Orthopedics;  Laterality: Left;   THYROIDECTOMY, PARTIAL  1998   TOTAL HIP ARTHROPLASTY Left 05/02/2012   Procedure: LEFT TOTAL HIP ARTHROPLASTY ANTERIOR APPROACH;  Surgeon: Shelda Pal, MD;  Location: [facility];  Service: Orthopedics;  Laterality: Left;   Patient Active Problem List   Diagnosis Date Noted   Calcific tendinitis 06/15/2022   Preoperative testing 12/26/2020   Chronic right hip pain 08/20/2020   BPH with urinary obstruction 10/17/2018   Gout 08/11/2016   Depression with anxiety 08/11/2016   HTN (hypertension) 08/11/2016   ADHD (attention deficit  hyperactivity disorder), inattentive type 08/11/2016   Hypothyroidism 08/11/2016   Environmental allergies 08/11/2016   GERD (gastroesophageal reflux disease) 08/11/2016   Overweight (BMI 25.0-29.9) 05/03/2012     REFERRING PROVIDER: Steward Drone, MD  REFERRING DIAG: S46.012A (ICD-10-CM) - Traumatic complete tear of left rotator cuff, initial encounter  S/p Lt biceps repair (cuff in tact per MD)  Rationale for Evaluation and Treatment: Rehabilitation  THERAPY DIAG:  Acute pain of left shoulder  Muscle weakness (generalized)  ONSET DATE: DOS 06/15/22  Days since surgery: 8    SUBJECTIVE:  SUBJECTIVE STATEMENT: Pt has not had much pain. No issues with HEP and doing them every day. No pain but feels a slight pinch in the shoulder.    Eval:  Overall feeling really good. Reached into horizontal abduction and that was the final straw. I do have some discomfort in right shoulder. 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   PERTINENT HISTORY:  RC calcification, bil hip replacement, prior left knee injury  PAIN:  Are you having pain? No  PRECAUTIONS:  None  WEIGHT BEARING RESTRICTIONS:  Yes POSTOPERATIVE PLAN: He will follow the biceps repair protocol.  He will be active range of motion overhead as tolerated.  FALLS:  Has patient fallen in last 6 months? No   OCCUPATION:  It consultant  PLOF:  Independent  PATIENT GOALS:  Move around and use arm normally   OBJECTIVE:   PATIENT SURVEYS:  FOTO 41  COGNITIVE STATUS: Within functional limits for tasks assessed   RED FLAGS: None    SENSATION: WFL  POSTURE:  EVAL: mild forward head  HAND DOMINANCE:   Right    Body Part #1 Shoulder  PALPATION: No s/s of infection at eval  UE ROM UPPER EXTREMITY ROM:  EVAL- able to demo activation of all RC, flexion and abd to 45, full range available in left elbow   TREATMENT:        6/5  PROM with LAD for joint relaxation  AAROM flexion to 110 2x10 Table slide flexion 2x10 Cane ABD 2x10 to 90  Wound inspect and bandage change- clean dry incision sites without draining or signs of erythema                                                                                                                          DATE: 5/31 Bandages changed Scap retraction Upper trap & levator stretch Elbow AAROM, AROM, pendulum     PATIENT EDUCATION:  Education details: Anatomy of condition, POC, HEP, exercise form/rationale Person educated: Patient Education method: Explanation, Demonstration, Tactile cues, Verbal cues, and Handouts Education comprehension: verbalized understanding, returned demonstration, verbal cues required, tactile cues required, and needs further education  HOME EXERCISE PROGRAM: [id number]   ASSESSMENT:  CLINICAL IMPRESSION: Pt able to progress AAROM at today's session for flexion and ABD to controlled range without pain. Pt does have guarding spasm with PROM that required LAD with oscillation for relaxation. Pt HEP updated today and pt advised on precautions (no lifting/resistance, no extension, or horizontal ABD) at this time. Plan to continue with biceps teno protocol with MD instruction of progression to AROM flexion as tolerated. Pt would benefit from continued skilled therapy in order to reach goals and maximize functional L UE strength and ROM for return to normal daily activity.   OBJECTIVE IMPAIRMENTS: decreased activity tolerance, decreased ROM, decreased strength, increased muscle spasms,  impaired flexibility, impaired UE functional use, improper body mechanics, and pain.   ACTIVITY LIMITATIONS: carrying, lifting,  dressing, reach over head, and hygiene/grooming  PARTICIPATION LIMITATIONS: meal prep, cleaning, laundry, driving, shopping, community activity, and occupation  PERSONAL FACTORS: Time since onset of injury/illness/exacerbation are also affecting patient's functional outcome.   REHAB POTENTIAL: Good  CLINICAL DECISION MAKING: Stable/uncomplicated  EVALUATION COMPLEXITY: Low   GOALS: Goals reviewed with patient? Yes  SHORT TERM GOALS: Target date: 6/22  GHJ flexion and abd to 90 against gravity with proper form Baseline: Goal status: INITIAL  2.  Completing HEP and ADLs pain <=3/10 Baseline:  Goal status: INITIAL   LONG TERM GOALS: Target date: POC date  Meet FOTO goal Baseline:  Goal status: INITIAL  2.  Able to reach all overhead objects without limitation by pain Baseline:  Goal status: INITIAL  3.  Gross GHJ strength 5/5 Baseline:  Goal status: INITIAL  4.  Able to complete all aDLs without limitation by pain Baseline:  Goal status: INITIAL   PLAN:  PT FREQUENCY: 1-2x/week  PT DURATION: through POC date  PLANNED INTERVENTIONS: Therapeutic exercises, Therapeutic activity, Neuromuscular re-education, Patient/Family education, Self Care, Joint mobilization, Aquatic Therapy, Dry Needling, Electrical stimulation, Spinal mobilization, Cryotherapy, Moist heat, scar mobilization, Taping, Traction, Ionotophoresis 4mg /ml Dexamethasone, Manual therapy, and Re-evaluation.  PLAN FOR NEXT SESSION: AROM as tolerated  Zebedee Iba PT, DPT 06/23/22 10:15 AM

## 2022-06-25 ENCOUNTER — Encounter (HOSPITAL_BASED_OUTPATIENT_CLINIC_OR_DEPARTMENT_OTHER): Payer: Self-pay | Admitting: Physical Therapy

## 2022-06-30 ENCOUNTER — Ambulatory Visit (INDEPENDENT_AMBULATORY_CARE_PROVIDER_SITE_OTHER): Payer: 59 | Admitting: Orthopaedic Surgery

## 2022-06-30 DIAGNOSIS — S46012A Strain of muscle(s) and tendon(s) of the rotator cuff of left shoulder, initial encounter: Secondary | ICD-10-CM

## 2022-06-30 NOTE — Progress Notes (Signed)
Chief Complaint: Left shoulder arthroscopic debridement and biceps tenodesis 5/28     History of Present Illness:   06/30/2022:   Patrick [name] is a 62 y.o. male presents today for follow-up status post the above procedure.  Overall he is doing extremely well.  Pain is minimal.  He has been out of his sling and working with physical therapy which is going quite well.    Surgical History:   none  PMH/PSH/Family History/Social History/Meds/Allergies:    Past Medical History:  Diagnosis Date   ADHD (attention deficit hyperactivity disorder), inattentive type    sees Dr. Evelene Croon    Anxiety    sees Dr. Evelene Croon    Arthritis    DDD (degenerative disc disease), lumbar    Depression    sees Dr. Evelene Croon    Environmental allergies    GERD (gastroesophageal reflux disease)    History of blood transfusion    Hypertension    Hypothyroidism    partial thyroidectomy for benign nodule    Past Surgical History:  Procedure Laterality Date   BACK SURGERY Right 1985   microdiscectomy   COLONOSCOPY  02/19/2020   per Dr. Dulce Sellar, precancerous polyp, repeat in 3 yrs   SHOULDER ARTHROSCOPY WITH ROTATOR CUFF REPAIR Left 06/15/2022   Procedure: LEFT SHOULDER ARTHROSCOPY WITH ROTATOR CUFF REPAIR;  Surgeon: Huel Cote, MD;  Location: [facility];  Service: Orthopedics;  Laterality: Left;   THYROIDECTOMY, PARTIAL  1998   TOTAL HIP ARTHROPLASTY Left 05/02/2012   Procedure: LEFT TOTAL HIP ARTHROPLASTY ANTERIOR APPROACH;  Surgeon: Shelda Pal, MD;  Location: [facility];  Service: Orthopedics;  Laterality: Left;   Social History   Socioeconomic History   Marital status: Married    Spouse name: Not on file   Number of children: Not on file   Years of education: Not on file   Highest education level: Not on file  Occupational History   Not on file  Tobacco Use   Smoking status: Former    Types: Cigarettes    Quit date: 04/27/2002    Years since  quitting: 20.1   Smokeless tobacco: Former    Quit date: 04/27/2002  Substance and Sexual Activity   Alcohol use: Yes    Comment: 3-4 glasses wine/beer  week   Drug use: No   Sexual activity: Not on file  Other Topics Concern   Not on file  Social History Narrative   Not on file   Social Determinants of Health   Financial Resource Strain: Not on file  Food Insecurity: No Food Insecurity (01/06/2022)   Hunger Vital Sign    Worried About Running Out of Food in the Last Year: Never true    Ran Out of Food in the Last Year: Never true  Transportation Needs: No Transportation Needs (01/06/2022)   PRAPARE - Administrator, Civil Service (Medical): No    Lack of Transportation (Non-Medical): No  Physical Activity: Not on file  Stress: Not on file  Social Connections: Not on file   No family history on file. No Active Allergies  Current Outpatient Medications  Medication Sig Dispense Refill   allopurinol (ZYLOPRIM) 100 MG tablet TAKE 1 TABLET BY MOUTH EVERY DAY 90 tablet 0   amLODipine (NORVASC) 5 MG tablet TAKE 1 TABLET BY MOUTH  TWICE DAILY 180 tablet 0   amphetamine-dextroamphetamine (ADDERALL XR) 30 MG 24 hr capsule Take 1 capsule (30 mg total) by mouth every morning. 30 capsule 0   aspirin EC 325 MG tablet Take 1 tablet (325 mg total) by mouth daily. 14 tablet 0   FLUoxetine (PROZAC) 40 MG capsule Take 40 mg by mouth daily.     levothyroxine (SYNTHROID) 50 MCG tablet TAKE 1 TABLET(50 MCG) BY MOUTH DAILY BEFORE BREAKFAST 90 tablet 0   loratadine (CLARITIN) 10 MG tablet Take 10 mg by mouth daily.     losartan (COZAAR) 100 MG tablet TAKE 1 TABLET BY MOUTH EVERY DAY 90 tablet 0   omeprazole (PRILOSEC) 20 MG capsule TAKE 1 CAPSULE BY MOUTH DAILY 90 capsule 3   propranolol ER (INDERAL LA) 80 MG 24 hr capsule TAKE 1 CAPSULE BY MOUTH DAILY BEFORE BREAKFAST 90 capsule 0   tamsulosin (FLOMAX) 0.4 MG CAPS capsule Take 1 capsule (0.4 mg total) by mouth daily. 30 capsule 3   No  current facility-administered medications for this visit.   No results found.  Review of Systems:   A ROS was performed including pertinent positives and negatives as documented in the HPI.  Physical Exam :   Constitutional: NAD and appears stated age Neurological: Alert and oriented Psych: Appropriate affect and cooperative There were no vitals taken for this visit.   Comprehensive Musculoskeletal Exam:    Left shoulder incisions are well-appearing without erythema or drainage.  In the standing position he is able to forward elevate to 160 degrees and external rotation at the side is to 50 degrees internal rotation deferred    Imaging:     I personally reviewed and interpreted the radiographs.   Assessment:   62 y.o. male 2-week status post left arthroscopic debridement biceps tenodesis overall doing very well.  This time we will continue to advance according the biceps tendon protocol.  I will plan to see him back in 4 weeks for reassessment  Plan :    -Return to clinic in 4 weeks for reassessment  I personally saw and evaluated the patient, and participated in the management and treatment plan.  Huel Cote, MD Attending Physician, Orthopedic Surgery  This document was dictated using Dragon voice recognition software. A reasonable attempt at proof reading has been made to minimize errors.

## 2022-07-02 ENCOUNTER — Encounter (HOSPITAL_BASED_OUTPATIENT_CLINIC_OR_DEPARTMENT_OTHER): Payer: Self-pay | Admitting: Physical Therapy

## 2022-07-02 NOTE — Therapy (Signed)
OUTPATIENT PHYSICAL THERAPY TREATMENT   Patient Name: Patrick [name] MRN: [id number] DOB:1960/02/26, 62 y.o., male Today's Date: 07/03/2022  END OF SESSION:  PT End of Session - 07/03/22 1048     Visit Number 3    Number of Visits 13    Date for PT Re-Evaluation 08/13/22    Authorization Type AETNA    PT Start Time 1048    PT Stop Time 1126    PT Time Calculation (min) 38 min    Activity Tolerance Patient tolerated treatment well    Behavior During Therapy WFL for tasks assessed/performed               Past Medical History:  Diagnosis Date   ADHD (attention deficit hyperactivity disorder), inattentive type    sees Dr. Evelene Croon    Anxiety    sees Dr. Evelene Croon    Arthritis    DDD (degenerative disc disease), lumbar    Depression    sees Dr. Evelene Croon    Environmental allergies    GERD (gastroesophageal reflux disease)    History of blood transfusion    Hypertension    Hypothyroidism    partial thyroidectomy for benign nodule    Past Surgical History:  Procedure Laterality Date   BACK SURGERY Right 1985   microdiscectomy   COLONOSCOPY  02/19/2020   per Dr. Dulce Sellar, precancerous polyp, repeat in 3 yrs   SHOULDER ARTHROSCOPY WITH ROTATOR CUFF REPAIR Left 06/15/2022   Procedure: LEFT SHOULDER ARTHROSCOPY WITH ROTATOR CUFF REPAIR;  Surgeon: Huel Cote, MD;  Location: [facility];  Service: Orthopedics;  Laterality: Left;   THYROIDECTOMY, PARTIAL  1998   TOTAL HIP ARTHROPLASTY Left 05/02/2012   Procedure: LEFT TOTAL HIP ARTHROPLASTY ANTERIOR APPROACH;  Surgeon: Shelda Pal, MD;  Location: [facility];  Service: Orthopedics;  Laterality: Left;   Patient Active Problem List   Diagnosis Date Noted   Calcific tendinitis 06/15/2022   Preoperative testing 12/26/2020   Chronic right hip pain 08/20/2020   BPH with urinary obstruction 10/17/2018   Gout 08/11/2016   Depression with anxiety 08/11/2016   HTN (hypertension) 08/11/2016   ADHD (attention deficit  hyperactivity disorder), inattentive type 08/11/2016   Hypothyroidism 08/11/2016   Environmental allergies 08/11/2016   GERD (gastroesophageal reflux disease) 08/11/2016   Overweight (BMI 25.0-29.9) 05/03/2012     REFERRING PROVIDER: Steward Drone, MD  REFERRING DIAG: S46.012A (ICD-10-CM) - Traumatic complete tear of left rotator cuff, initial encounter  S/p Lt biceps repair (cuff in tact per MD)  Rationale for Evaluation and Treatment: Rehabilitation  THERAPY DIAG:  Acute pain of left shoulder  Muscle weakness (generalized)  ONSET DATE: DOS 06/15/22  Days since surgery: 18    SUBJECTIVE:  SUBJECTIVE STATEMENT: No constant pain. I feel it every now and then. Denies N/T.    Eval:  Overall feeling really good. Reached into horizontal abduction and that was the final straw. I do have some discomfort in right shoulder. 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   PERTINENT HISTORY:  RC calcification, bil hip replacement, prior left knee injury  PAIN:  Are you having pain? No  PRECAUTIONS:  None  WEIGHT BEARING RESTRICTIONS:  Yes POSTOPERATIVE PLAN: He will follow the biceps repair protocol.  He will be active range of motion overhead as tolerated.  FALLS:  Has patient fallen in last 6 months? No   OCCUPATION:  It consultant  PLOF:  Independent  PATIENT GOALS:  Move around and use arm normally   OBJECTIVE:   PATIENT SURVEYS:  FOTO 41  COGNITIVE STATUS: Within functional limits for tasks assessed   RED FLAGS: None    SENSATION: WFL  POSTURE:  EVAL: mild forward head  HAND DOMINANCE:  Right    Body Part #1 Shoulder  PALPATION: No s/s of infection  at eval  UE ROM- LEFT UPPER EXTREMITY ROM:  EVAL- able to demo activation of all RC, flexion and abd to 45, full range available in left elbow 6/15: flexion 150, IR L3, ER T2   TREATMENT:        6/15 STM subscap Resisted supine flexion 90-145 with assisted scap motion upon return Supine Swedish ABCs 1lb Sidelying GHJ abd with assist for scap motion Sidelying protraction/retraction resist by PT Sidelying GHJ flexion to 90  Standing triceps kick, 1lb, Rt hand on table  6/5  PROM with LAD for joint relaxation  AAROM flexion to 110 2x10 Table slide flexion 2x10 Cane ABD 2x10 to 90  Wound inspect and bandage change- clean dry incision sites without draining or signs of erythema                                                                                                                          PATIENT EDUCATION:  Education details: Anatomy of condition, POC, HEP, exercise form/rationale Person educated: Patient Education method: Explanation, Demonstration, Tactile cues, Verbal cues, and Handouts Education comprehension: verbalized understanding, returned demonstration, verbal cues required, tactile cues required, and needs further education  HOME EXERCISE PROGRAM: [id number]   ASSESSMENT:  CLINICAL IMPRESSION: Improved ROM with reduced hike following subscap release. Fatigue through triceps when proper form obtained. Tactile cues for scap retraction.    OBJECTIVE IMPAIRMENTS: decreased activity tolerance, decreased ROM, decreased strength, increased muscle spasms, impaired flexibility, impaired UE functional use, improper body mechanics, and pain.   ACTIVITY LIMITATIONS: carrying, lifting, dressing, reach over head, and hygiene/grooming  PARTICIPATION LIMITATIONS: meal prep, cleaning, laundry, driving, shopping, community activity, and occupation  PERSONAL FACTORS: Time since onset of injury/illness/exacerbation are also affecting patient's functional outcome.  REHAB POTENTIAL: Good  CLINICAL DECISION MAKING: Stable/uncomplicated  EVALUATION COMPLEXITY: Low   GOALS: Goals reviewed with patient? Yes  SHORT TERM GOALS: Target date: 6/22  GHJ flexion and abd to 90 against gravity with proper form Baseline: Goal status: achieved  2.  Completing HEP and ADLs pain <=3/10 Baseline:  Goal status:achieved   LONG TERM GOALS: Target date: POC date  Meet FOTO goal Baseline:  Goal status: INITIAL  2.  Able to reach all overhead objects without limitation by pain Baseline:  Goal status: INITIAL  3.  Gross GHJ strength 5/5 Baseline:  Goal status: INITIAL  4.  Able to complete all aDLs without limitation by pain Baseline:  Goal status: INITIAL   PLAN:  PT FREQUENCY: 1-2x/week  PT DURATION: through POC date  PLANNED INTERVENTIONS: Therapeutic exercises, Therapeutic activity, Neuromuscular re-education, Patient/Family education, Self Care, Joint mobilization, Aquatic Therapy, Dry Needling, Electrical stimulation, Spinal mobilization, Cryotherapy, Moist heat, scar mobilization, Taping, Traction, Ionotophoresis 4mg /ml Dexamethasone, Manual therapy, and Re-evaluation.  PLAN FOR NEXT SESSION: periscap strength/stability  Donella Pascarella C. Lynel Forester PT, DPT 07/03/22 11:26 AM

## 2022-07-03 ENCOUNTER — Ambulatory Visit (HOSPITAL_BASED_OUTPATIENT_CLINIC_OR_DEPARTMENT_OTHER): Payer: 59 | Admitting: Physical Therapy

## 2022-07-03 ENCOUNTER — Encounter (HOSPITAL_BASED_OUTPATIENT_CLINIC_OR_DEPARTMENT_OTHER): Payer: Self-pay | Admitting: Physical Therapy

## 2022-07-03 DIAGNOSIS — M25512 Pain in left shoulder: Secondary | ICD-10-CM

## 2022-07-03 DIAGNOSIS — M6281 Muscle weakness (generalized): Secondary | ICD-10-CM

## 2022-07-05 ENCOUNTER — Ambulatory Visit (HOSPITAL_BASED_OUTPATIENT_CLINIC_OR_DEPARTMENT_OTHER): Payer: 59 | Admitting: Physical Therapy

## 2022-07-09 ENCOUNTER — Ambulatory Visit (HOSPITAL_BASED_OUTPATIENT_CLINIC_OR_DEPARTMENT_OTHER): Payer: 59 | Admitting: Physical Therapy

## 2022-07-09 ENCOUNTER — Encounter (HOSPITAL_BASED_OUTPATIENT_CLINIC_OR_DEPARTMENT_OTHER): Payer: Self-pay | Admitting: Physical Therapy

## 2022-07-09 DIAGNOSIS — M25512 Pain in left shoulder: Secondary | ICD-10-CM | POA: Diagnosis not present

## 2022-07-09 DIAGNOSIS — M6281 Muscle weakness (generalized): Secondary | ICD-10-CM

## 2022-07-09 NOTE — Therapy (Signed)
OUTPATIENT PHYSICAL THERAPY TREATMENT   Patient Name: Patrick [name] MRN: [id number] DOB:August 06, 1960, 62 y.o., male Today's Date: 07/09/2022  END OF SESSION:  PT End of Session - 07/09/22 0949     Visit Number 4    Number of Visits 13    Date for PT Re-Evaluation 08/13/22    Authorization Type AETNA    PT Start Time [phone]   pt and PT both running late   PT Stop Time 1020    PT Time Calculation (min) 41 min    Activity Tolerance Patient tolerated treatment well    Behavior During Therapy WFL for tasks assessed/performed                Past Medical History:  Diagnosis Date   ADHD (attention deficit hyperactivity disorder), inattentive type    sees Dr. Evelene Croon    Anxiety    sees Dr. Evelene Croon    Arthritis    DDD (degenerative disc disease), lumbar    Depression    sees Dr. Evelene Croon    Environmental allergies    GERD (gastroesophageal reflux disease)    History of blood transfusion    Hypertension    Hypothyroidism    partial thyroidectomy for benign nodule    Past Surgical History:  Procedure Laterality Date   BACK SURGERY Right 1985   microdiscectomy   COLONOSCOPY  02/19/2020   per Dr. Dulce Sellar, precancerous polyp, repeat in 3 yrs   SHOULDER ARTHROSCOPY WITH ROTATOR CUFF REPAIR Left 06/15/2022   Procedure: LEFT SHOULDER ARTHROSCOPY WITH ROTATOR CUFF REPAIR;  Surgeon: Huel Cote, MD;  Location: [facility];  Service: Orthopedics;  Laterality: Left;   THYROIDECTOMY, PARTIAL  1998   TOTAL HIP ARTHROPLASTY Left 05/02/2012   Procedure: LEFT TOTAL HIP ARTHROPLASTY ANTERIOR APPROACH;  Surgeon: Shelda Pal, MD;  Location: [facility];  Service: Orthopedics;  Laterality: Left;   Patient Active Problem List   Diagnosis Date Noted   Calcific tendinitis 06/15/2022   Preoperative testing 12/26/2020   Chronic right hip pain 08/20/2020   BPH with urinary obstruction 10/17/2018   Gout 08/11/2016   Depression with anxiety 08/11/2016   HTN (hypertension) 08/11/2016    ADHD (attention deficit hyperactivity disorder), inattentive type 08/11/2016   Hypothyroidism 08/11/2016   Environmental allergies 08/11/2016   GERD (gastroesophageal reflux disease) 08/11/2016   Overweight (BMI 25.0-29.9) 05/03/2012     REFERRING PROVIDER: Steward Drone, MD  REFERRING DIAG: S46.012A (ICD-10-CM) - Traumatic complete tear of left rotator cuff, initial encounter  S/p Lt biceps repair (cuff in tact per MD)  Rationale for Evaluation and Treatment: Rehabilitation  THERAPY DIAG:  Acute pain of left shoulder  Muscle weakness (generalized)  ONSET DATE: DOS 06/15/22  Days since surgery: 24    SUBJECTIVE:  SUBJECTIVE STATEMENT:  I'm doing good, shoulder is feeling good    Eval:  Overall feeling really good. Reached into horizontal abduction and that was the final straw. I do have some discomfort in right shoulder. 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   PERTINENT HISTORY:  RC calcification, bil hip replacement, prior left knee injury  PAIN:  Are you having pain? Yes: NPRS scale: 2/10 Pain location: L shoulder  Pain description: grind, tooth ache  Aggravating factors: reaching  Relieving factors: getting arm back close to body   PRECAUTIONS:  None  WEIGHT BEARING RESTRICTIONS:  Yes POSTOPERATIVE PLAN: He will follow the biceps repair protocol.  He will be active range of motion overhead as tolerated.  FALLS:  Has patient fallen in last 6 months? No   OCCUPATION:  It consultant  PLOF:  Independent  PATIENT GOALS:  Move around and use arm normally   OBJECTIVE:   PATIENT SURVEYS:  FOTO 41  COGNITIVE STATUS: Within functional limits for tasks  assessed   RED FLAGS: None    SENSATION: WFL  POSTURE:  EVAL: mild forward head  HAND DOMINANCE:  Right    Body Part #1 Shoulder  PALPATION: No s/s of infection at eval  UE ROM- LEFT UPPER EXTREMITY ROM:  EVAL- able to demo activation of all RC, flexion and abd to 45, full range available in left elbow 6/15: flexion 150, IR L3, ER T2   TREATMENT:         07/09/22  Supine serratus punches 1# 2x15 Supine serratus circles 1# 2x12 B Supine serratus alphabet 2# 2 rounds with rest in between Sidelying GHJ ABD with cues for scap retraction/periscap activation x10 Sidelying GHJ flexion with cues for scap retraction/periscap activation x10 Doorway AAROM slides for flexion x10 with 3 second holds  Doorway AAROM sides for scaption x10 with 3 second holds  Standing scap retraction x15 with 2 second holds UEs on door Serratus punches with UEs on door x15 tactile cues for good SA activation/scap mobility      6/15 STM subscap Resisted supine flexion 90-145 with assisted scap motion upon return Supine Swedish ABCs 1lb Sidelying GHJ abd with assist for scap motion Sidelying protraction/retraction resist by PT Sidelying GHJ flexion to 90  Standing triceps kick, 1lb, Rt hand on table   6/5  PROM with LAD for joint relaxation  AAROM flexion to 110 2x10 Table slide flexion 2x10 Cane ABD 2x10 to 90  Wound inspect and bandage change- clean dry incision sites without draining or signs of erythema                                                                                                                          PATIENT EDUCATION:  Education details: Anatomy of condition, POC, HEP, exercise form/rationale Person educated: Patient Education method: Explanation,  Demonstration, Tactile cues, Verbal cues, and Handouts Education comprehension: verbalized understanding, returned demonstration, verbal cues required, tactile cues required, and needs further education  HOME  EXERCISE PROGRAM: [id number]   ASSESSMENT:  CLINICAL IMPRESSION:  Mizraim arrives today doing well, we continued focus on periscapular strength and ROM today as appropriate per precautions. Did well, remains motivated to improve but he tells me he does have plans to go back home to [place] in about a month. Will adjust POC PRN given travel plans   OBJECTIVE IMPAIRMENTS: decreased activity tolerance, decreased ROM, decreased strength, increased muscle spasms, impaired flexibility, impaired UE functional use, improper body mechanics, and pain.   ACTIVITY LIMITATIONS: carrying, lifting, dressing, reach over head, and hygiene/grooming  PARTICIPATION LIMITATIONS: meal prep, cleaning, laundry, driving, shopping, community activity, and occupation  PERSONAL FACTORS: Time since onset of injury/illness/exacerbation are also affecting patient's functional outcome.   REHAB POTENTIAL: Good  CLINICAL DECISION MAKING: Stable/uncomplicated  EVALUATION COMPLEXITY: Low   GOALS: Goals reviewed with patient? Yes  SHORT TERM GOALS: Target date: 6/22  GHJ flexion and abd to 90 against gravity with proper form Baseline: Goal status: achieved  2.  Completing HEP and ADLs pain <=3/10 Baseline:  Goal status:achieved   LONG TERM GOALS: Target date: POC date  Meet FOTO goal Baseline:  Goal status: INITIAL  2.  Able to reach all overhead objects without limitation by pain Baseline:  Goal status: INITIAL  3.  Gross GHJ strength 5/5 Baseline:  Goal status: INITIAL  4.  Able to complete all aDLs without limitation by pain Baseline:  Goal status: INITIAL   PLAN:  PT FREQUENCY: 1-2x/week  PT DURATION: through POC date  PLANNED INTERVENTIONS: Therapeutic exercises, Therapeutic activity, Neuromuscular re-education, Patient/Family education, Self Care, Joint mobilization, Aquatic Therapy, Dry Needling, Electrical stimulation, Spinal mobilization, Cryotherapy, Moist heat, scar mobilization,  Taping, Traction, Ionotophoresis 4mg /ml Dexamethasone, Manual therapy, and Re-evaluation.  PLAN FOR NEXT SESSION: periscap strength/stability; going to [place] in about a month, adjust HEP/POC PRN   Lerry Liner PT, DPT, PN2   Supplemental Physical Therapist [facility]

## 2022-07-13 ENCOUNTER — Encounter (HOSPITAL_BASED_OUTPATIENT_CLINIC_OR_DEPARTMENT_OTHER): Payer: Self-pay | Admitting: Physical Therapy

## 2022-07-13 ENCOUNTER — Ambulatory Visit (HOSPITAL_BASED_OUTPATIENT_CLINIC_OR_DEPARTMENT_OTHER): Payer: 59 | Admitting: Physical Therapy

## 2022-07-13 DIAGNOSIS — M25512 Pain in left shoulder: Secondary | ICD-10-CM

## 2022-07-13 DIAGNOSIS — M6281 Muscle weakness (generalized): Secondary | ICD-10-CM

## 2022-07-13 NOTE — Therapy (Signed)
OUTPATIENT PHYSICAL THERAPY TREATMENT   Patient Name: Patrick [name] MRN: [id number] DOB:1961/01/09, 62 y.o., male Today's Date: 07/13/2022  END OF SESSION:  PT End of Session - 07/13/22 0935     Visit Number 5    Number of Visits 13    Date for PT Re-Evaluation 08/13/22    Authorization Type AETNA    PT Start Time 0934   late   PT Stop Time 1013    PT Time Calculation (min) 39 min    Activity Tolerance Patient tolerated treatment well    Behavior During Therapy WFL for tasks assessed/performed                Past Medical History:  Diagnosis Date   ADHD (attention deficit hyperactivity disorder), inattentive type    sees Dr. Evelene Croon    Anxiety    sees Dr. Evelene Croon    Arthritis    DDD (degenerative disc disease), lumbar    Depression    sees Dr. Evelene Croon    Environmental allergies    GERD (gastroesophageal reflux disease)    History of blood transfusion    Hypertension    Hypothyroidism    partial thyroidectomy for benign nodule    Past Surgical History:  Procedure Laterality Date   BACK SURGERY Right 1985   microdiscectomy   COLONOSCOPY  02/19/2020   per Dr. Dulce Sellar, precancerous polyp, repeat in 3 yrs   SHOULDER ARTHROSCOPY WITH ROTATOR CUFF REPAIR Left 06/15/2022   Procedure: LEFT SHOULDER ARTHROSCOPY WITH ROTATOR CUFF REPAIR;  Surgeon: Huel Cote, MD;  Location: [facility];  Service: Orthopedics;  Laterality: Left;   THYROIDECTOMY, PARTIAL  1998   TOTAL HIP ARTHROPLASTY Left 05/02/2012   Procedure: LEFT TOTAL HIP ARTHROPLASTY ANTERIOR APPROACH;  Surgeon: Shelda Pal, MD;  Location: [facility];  Service: Orthopedics;  Laterality: Left;   Patient Active Problem List   Diagnosis Date Noted   Calcific tendinitis 06/15/2022   Preoperative testing 12/26/2020   Chronic right hip pain 08/20/2020   BPH with urinary obstruction 10/17/2018   Gout 08/11/2016   Depression with anxiety 08/11/2016   HTN (hypertension) 08/11/2016   ADHD (attention  deficit hyperactivity disorder), inattentive type 08/11/2016   Hypothyroidism 08/11/2016   Environmental allergies 08/11/2016   GERD (gastroesophageal reflux disease) 08/11/2016   Overweight (BMI 25.0-29.9) 05/03/2012     REFERRING PROVIDER: Steward Drone, MD  REFERRING DIAG: S46.012A (ICD-10-CM) - Traumatic complete tear of left rotator cuff, initial encounter  S/p Lt biceps repair (cuff in tact per MD)  Rationale for Evaluation and Treatment: Rehabilitation  THERAPY DIAG:  Acute pain of left shoulder  Muscle weakness (generalized)  ONSET DATE: DOS 06/15/22  Days since surgery: 28    SUBJECTIVE:  SUBJECTIVE STATEMENT:  Pt states the shoulder is doing well. AROM into ABD and flexion can still cause a 4/10 pain. No pain at rest. Was mildly sore after last session. No issues with donning/doffing a shirt.    Eval:  Overall feeling really good. Reached into horizontal abduction and that was the final straw. I do have some discomfort in right shoulder. 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   PERTINENT HISTORY:  RC calcification, bil hip replacement, prior left knee injury  PAIN:  Are you having pain? No: NPRS scale: 0/10 Pain location: L shoulder  Pain description: grind, tooth ache  Aggravating factors: reaching  Relieving factors: getting arm back close to body   PRECAUTIONS:  None  WEIGHT BEARING RESTRICTIONS:  Yes POSTOPERATIVE PLAN: He will follow the biceps repair protocol.  He will be active range of motion overhead as tolerated.  FALLS:  Has patient fallen in last 6 months? No   OCCUPATION:  It consultant  PLOF:  Independent  PATIENT GOALS:  Move around  and use arm normally   OBJECTIVE:   PATIENT SURVEYS:  FOTO 41  COGNITIVE STATUS: Within functional limits for tasks assessed   RED FLAGS: None    SENSATION: WFL  POSTURE:  EVAL: mild forward head  HAND DOMINANCE:  Right    Body Part #1 Shoulder  PALPATION: No s/s of infection at eval  UE ROM- LEFT UPPER EXTREMITY ROM:  EVAL- able to demo activation of all RC, flexion and abd to 45, full range available in left elbow 6/15: flexion 150, IR L3, ER T2   TREATMENT:        6/25  Pulley's 2 min ABD and flexion  S/L ER 3x8  S/L ABD 2x10 Prone row 3x10 1# Prone ext 3x10 1# Supine 1lb ABC 2x Supine flexion 3x10 (trialed 1 lb, pinching pain) Wall push up 2x10   07/09/22  Supine serratus punches 1# 2x15 Supine serratus circles 1# 2x12 B Supine serratus alphabet 2# 2 rounds with rest in between Sidelying GHJ ABD with cues for scap retraction/periscap activation x10 Sidelying GHJ flexion with cues for scap retraction/periscap activation x10 Doorway AAROM slides for flexion x10 with 3 second holds  Doorway AAROM sides for scaption x10 with 3 second holds  Standing scap retraction x15 with 2 second holds UEs on door Serratus punches with UEs on door x15 tactile cues for good SA activation/scap mobility   ~   6/15 STM subscap Resisted supine flexion 90-145 with assisted scap motion upon return Supine Swedish ABCs 1lb Sidelying GHJ abd with assist for scap motion Sidelying protraction/retraction resist by PT Sidelying GHJ flexion to 90  Standing triceps kick, 1lb, Rt hand on table   6/5  PROM with LAD for joint relaxation  AAROM flexion to 110 2x10 Table slide flexion 2x10 Cane ABD 2x10 to 90  Wound inspect and bandage change- clean dry incision sites without draining or signs of erythema  PATIENT EDUCATION:  Education details:  Teacher, music of condition, POC, HEP, exercise form/rationale Person educated: Patient Education method: Explanation, Demonstration, Tactile cues, Verbal cues, and Handouts Education comprehension: verbalized understanding, returned demonstration, verbal cues required, tactile cues required, and needs further education  HOME EXERCISE PROGRAM: [id number]   ASSESSMENT:  CLINICAL IMPRESSION:  Pt able to progress to light resisted scapular motions as well as full antigravity of the L UE without increase in pain. AAROM going OH is nearly symmetrical to R side. Plan to update HEP if good tolerance to today's session and no increase in discomfort or pain. Pt would benefit from continued skilled therapy in order to reach goals and maximize functional L UE strength and ROM for full return to PLOF.   OBJECTIVE IMPAIRMENTS: decreased activity tolerance, decreased ROM, decreased strength, increased muscle spasms, impaired flexibility, impaired UE functional use, improper body mechanics, and pain.   ACTIVITY LIMITATIONS: carrying, lifting, dressing, reach over head, and hygiene/grooming  PARTICIPATION LIMITATIONS: meal prep, cleaning, laundry, driving, shopping, community activity, and occupation  PERSONAL FACTORS: Time since onset of injury/illness/exacerbation are also affecting patient's functional outcome.   REHAB POTENTIAL: Good  CLINICAL DECISION MAKING: Stable/uncomplicated  EVALUATION COMPLEXITY: Low   GOALS: Goals reviewed with patient? Yes  SHORT TERM GOALS: Target date: 6/22  GHJ flexion and abd to 90 against gravity with proper form Baseline: Goal status: achieved  2.  Completing HEP and ADLs pain <=3/10 Baseline:  Goal status:achieved   LONG TERM GOALS: Target date: POC date  Meet FOTO goal Baseline:  Goal status: INITIAL  2.  Able to reach all overhead objects without limitation by pain Baseline:  Goal status: INITIAL  3.  Gross GHJ strength 5/5 Baseline:  Goal  status: INITIAL  4.  Able to complete all aDLs without limitation by pain Baseline:  Goal status: INITIAL   PLAN:  PT FREQUENCY: 1-2x/week  PT DURATION: through POC date  PLANNED INTERVENTIONS: Therapeutic exercises, Therapeutic activity, Neuromuscular re-education, Patient/Family education, Self Care, Joint mobilization, Aquatic Therapy, Dry Needling, Electrical stimulation, Spinal mobilization, Cryotherapy, Moist heat, scar mobilization, Taping, Traction, Ionotophoresis 4mg /ml Dexamethasone, Manual therapy, and Re-evaluation.  PLAN FOR NEXT SESSION: periscap strength/stability; going to [place] in about a month, adjust HEP/POC PRN   Zebedee Iba PT, DPT 07/13/22 10:14 AM

## 2022-07-16 ENCOUNTER — Encounter (HOSPITAL_BASED_OUTPATIENT_CLINIC_OR_DEPARTMENT_OTHER): Payer: Self-pay | Admitting: Physical Therapy

## 2022-07-16 ENCOUNTER — Ambulatory Visit (HOSPITAL_BASED_OUTPATIENT_CLINIC_OR_DEPARTMENT_OTHER): Payer: 59 | Admitting: Physical Therapy

## 2022-07-16 DIAGNOSIS — M6281 Muscle weakness (generalized): Secondary | ICD-10-CM

## 2022-07-16 DIAGNOSIS — M25512 Pain in left shoulder: Secondary | ICD-10-CM | POA: Diagnosis not present

## 2022-07-16 NOTE — Therapy (Signed)
OUTPATIENT PHYSICAL THERAPY TREATMENT   Patient Name: Patrick [name] MRN: [id number] DOB:[birth year], 62 y.o., male Today's Date: 07/16/2022  END OF SESSION:  PT End of Session - 07/16/22 0939     Visit Number 6    Number of Visits 13    Date for PT Re-Evaluation 08/13/22    Authorization Type AETNA    PT Start Time [phone]   arrives late   PT Stop Time 1014    PT Time Calculation (min) 38 min    Activity Tolerance Patient tolerated treatment well    Behavior During Therapy WFL for tasks assessed/performed                Past Medical History:  Diagnosis Date   ADHD (attention deficit hyperactivity disorder), inattentive type    sees Dr. Evelene Croon    Anxiety    sees Dr. Evelene Croon    Arthritis    DDD (degenerative disc disease), lumbar    Depression    sees Dr. Evelene Croon    Environmental allergies    GERD (gastroesophageal reflux disease)    History of blood transfusion    Hypertension    Hypothyroidism    partial thyroidectomy for benign nodule    Past Surgical History:  Procedure Laterality Date   BACK SURGERY Right 1985   microdiscectomy   COLONOSCOPY  02/19/2020   per Dr. Dulce Sellar, precancerous polyp, repeat in 3 yrs   SHOULDER ARTHROSCOPY WITH ROTATOR CUFF REPAIR Left 06/15/2022   Procedure: LEFT SHOULDER ARTHROSCOPY WITH ROTATOR CUFF REPAIR;  Surgeon: Huel Cote, MD;  Location: [facility];  Service: Orthopedics;  Laterality: Left;   THYROIDECTOMY, PARTIAL  1998   TOTAL HIP ARTHROPLASTY Left 05/02/2012   Procedure: LEFT TOTAL HIP ARTHROPLASTY ANTERIOR APPROACH;  Surgeon: Shelda Pal, MD;  Location: [facility];  Service: Orthopedics;  Laterality: Left;   Patient Active Problem List   Diagnosis Date Noted   Calcific tendinitis 06/15/2022   Preoperative testing 12/26/2020   Chronic right hip pain 08/20/2020   BPH with urinary obstruction 10/17/2018   Gout 08/11/2016   Depression with anxiety 08/11/2016   HTN (hypertension) 08/11/2016   ADHD  (attention deficit hyperactivity disorder), inattentive type 08/11/2016   Hypothyroidism 08/11/2016   Environmental allergies 08/11/2016   GERD (gastroesophageal reflux disease) 08/11/2016   Overweight (BMI 25.0-29.9) 05/03/2012     REFERRING PROVIDER: Steward Drone, MD  REFERRING DIAG: S46.012A (ICD-10-CM) - Traumatic complete tear of left rotator cuff, initial encounter  S/p Lt biceps repair (cuff in tact per MD)  Rationale for Evaluation and Treatment: Rehabilitation  THERAPY DIAG:  Acute pain of left shoulder  Muscle weakness (generalized)  ONSET DATE: DOS 06/15/22  Days since surgery: 31    SUBJECTIVE:  SUBJECTIVE STATEMENT:  Pt states the shoulder was mildly sore after last session but only lasted for 1 evening. The shoulder felt normal again after 1 day.    Eval:  Overall feeling really good. Reached into horizontal abduction and that was the final straw. I do have some discomfort in right shoulder. 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   PERTINENT HISTORY:  RC calcification, bil hip replacement, prior left knee injury  PAIN:  Are you having pain? No: NPRS scale: 0/10 Pain location: L shoulder  Pain description: grind, tooth ache  Aggravating factors: reaching  Relieving factors: getting arm back close to body   PRECAUTIONS:  None  WEIGHT BEARING RESTRICTIONS:  Yes POSTOPERATIVE PLAN: He will follow the biceps repair protocol.  He will be active range of motion overhead as tolerated.  FALLS:  Has patient fallen in last 6 months? No   OCCUPATION:  It consultant  PLOF:  Independent  PATIENT GOALS:  Move around and use arm normally   OBJECTIVE:    PATIENT SURVEYS:  FOTO 41  COGNITIVE STATUS: Within functional limits for tasks assessed   RED FLAGS: None    SENSATION: WFL  POSTURE:  EVAL: mild forward head  HAND DOMINANCE:  Right    Body Part #1 Shoulder  PALPATION: No s/s of infection at eval  UE ROM- LEFT UPPER EXTREMITY ROM:  EVAL- able to demo activation of all RC, flexion and abd to 45, full range available in left elbow 6/15: flexion 150, IR L3, ER T2   TREATMENT:        6/28  Pulley's 2 min ABD and flexion  Supine serratus punch 3x10 1lb (with ER on ascent) Standing wall ball 2x20 CW and CCW  S/L ER 3x10 1# S/L ABD 3x10 YTB row 3x10 1# YTB ext 3x10 1# Supine 1lb ABC 2x Supine flexion 2x10 1lb Wall push up 2x10  6/25  Pulley's 2 min ABD and flexion  S/L ER 3x8  S/L ABD 2x10 Prone row 3x10 1# Prone ext 3x10 1# Supine 1lb ABC 2x Supine flexion 3x10 (trialed 1 lb, pinching pain) Wall push up 2x10   07/09/22  Supine serratus punches 1# 2x15 Supine serratus circles 1# 2x12 B Supine serratus alphabet 2# 2 rounds with rest in between Sidelying GHJ ABD with cues for scap retraction/periscap activation x10 Sidelying GHJ flexion with cues for scap retraction/periscap activation x10 Doorway AAROM slides for flexion x10 with 3 second holds  Doorway AAROM sides for scaption x10 with 3 second holds  Standing scap retraction x15 with 2 second holds UEs on door Serratus punches with UEs on door x15 tactile cues for good SA activation/scap mobility   ~   6/15 STM subscap Resisted supine flexion 90-145 with assisted scap motion upon return Supine Swedish ABCs 1lb Sidelying GHJ abd with assist for scap motion Sidelying protraction/retraction resist by PT Sidelying GHJ flexion to 90  Standing triceps kick, 1lb, Rt hand on table   6/5  PROM with LAD for joint relaxation  AAROM flexion to 110 2x10 Table slide flexion 2x10 Cane ABD 2x10 to 90  Wound inspect and bandage change- clean  dry incision sites without draining or signs of erythema  PATIENT EDUCATION:  Education details: Teacher, music of condition, POC, HEP, exercise form/rationale Person educated: Patient Education method: Explanation, Demonstration, Tactile cues, Verbal cues, and Handouts Education comprehension: verbalized understanding, returned demonstration, verbal cues required, tactile cues required, and needs further education  HOME EXERCISE PROGRAM: [id number]   ASSESSMENT:  CLINICAL IMPRESSION:  Pt HEP progressed at today's visit without issue. Pt scapular resistance exercise increased and pt was able to start weight long lever flexion today without pain like last session. Pt's continues to demonstrate great OH ROM as well as rotational. Plan to decrease frequency at this time with focus on progressive strengthening as per protocol. Pt making very steady progress with therapy. Pt would benefit from continued skilled therapy in order to reach goals and maximize functional L UE strength and ROM for full return to PLOF.   OBJECTIVE IMPAIRMENTS: decreased activity tolerance, decreased ROM, decreased strength, increased muscle spasms, impaired flexibility, impaired UE functional use, improper body mechanics, and pain.   ACTIVITY LIMITATIONS: carrying, lifting, dressing, reach over head, and hygiene/grooming  PARTICIPATION LIMITATIONS: meal prep, cleaning, laundry, driving, shopping, community activity, and occupation  PERSONAL FACTORS: Time since onset of injury/illness/exacerbation are also affecting patient's functional outcome.   REHAB POTENTIAL: Good  CLINICAL DECISION MAKING: Stable/uncomplicated  EVALUATION COMPLEXITY: Low   GOALS: Goals reviewed with patient? Yes  SHORT TERM GOALS: Target date: 6/22  GHJ flexion and abd to 90 against gravity with proper form Baseline: Goal  status: achieved  2.  Completing HEP and ADLs pain <=3/10 Baseline:  Goal status:achieved   LONG TERM GOALS: Target date: POC date  Meet FOTO goal Baseline:  Goal status: INITIAL  2.  Able to reach all overhead objects without limitation by pain Baseline:  Goal status: INITIAL  3.  Gross GHJ strength 5/5 Baseline:  Goal status: INITIAL  4.  Able to complete all aDLs without limitation by pain Baseline:  Goal status: INITIAL   PLAN:  PT FREQUENCY: 1-2x/week  PT DURATION: through POC date  PLANNED INTERVENTIONS: Therapeutic exercises, Therapeutic activity, Neuromuscular re-education, Patient/Family education, Self Care, Joint mobilization, Aquatic Therapy, Dry Needling, Electrical stimulation, Spinal mobilization, Cryotherapy, Moist heat, scar mobilization, Taping, Traction, Ionotophoresis 4mg /ml Dexamethasone, Manual therapy, and Re-evaluation.  PLAN FOR NEXT SESSION: periscap strength/stability; going to [place] in about a month, adjust HEP/POC PRN   Zebedee Iba PT, DPT 07/16/22 10:15 AM

## 2022-07-21 ENCOUNTER — Encounter (HOSPITAL_BASED_OUTPATIENT_CLINIC_OR_DEPARTMENT_OTHER): Payer: Self-pay | Admitting: Physical Therapy

## 2022-07-21 ENCOUNTER — Ambulatory Visit (HOSPITAL_BASED_OUTPATIENT_CLINIC_OR_DEPARTMENT_OTHER): Payer: 59 | Attending: Orthopaedic Surgery | Admitting: Physical Therapy

## 2022-07-21 DIAGNOSIS — M25512 Pain in left shoulder: Secondary | ICD-10-CM | POA: Diagnosis present

## 2022-07-21 DIAGNOSIS — M6281 Muscle weakness (generalized): Secondary | ICD-10-CM | POA: Insufficient documentation

## 2022-07-21 NOTE — Therapy (Signed)
OUTPATIENT PHYSICAL THERAPY TREATMENT   Patient Name: Patrick [name] MRN: [id number] DOB:12-06-1960, 62 y.o., male Today's Date: 07/21/2022  END OF SESSION:  PT End of Session - 07/21/22 1319     Visit Number 7    Number of Visits 13    Date for PT Re-Evaluation 08/13/22    Authorization Type AETNA    PT Start Time 1318   arrives late   PT Stop Time 1353    PT Time Calculation (min) 35 min    Activity Tolerance Patient tolerated treatment well    Behavior During Therapy WFL for tasks assessed/performed                Past Medical History:  Diagnosis Date   ADHD (attention deficit hyperactivity disorder), inattentive type    sees Dr. Evelene Croon    Anxiety    sees Dr. Evelene Croon    Arthritis    DDD (degenerative disc disease), lumbar    Depression    sees Dr. Evelene Croon    Environmental allergies    GERD (gastroesophageal reflux disease)    History of blood transfusion    Hypertension    Hypothyroidism    partial thyroidectomy for benign nodule    Past Surgical History:  Procedure Laterality Date   BACK SURGERY Right 1985   microdiscectomy   COLONOSCOPY  02/19/2020   per Dr. Dulce Sellar, precancerous polyp, repeat in 3 yrs   SHOULDER ARTHROSCOPY WITH ROTATOR CUFF REPAIR Left 06/15/2022   Procedure: LEFT SHOULDER ARTHROSCOPY WITH ROTATOR CUFF REPAIR;  Surgeon: Huel Cote, MD;  Location: [facility];  Service: Orthopedics;  Laterality: Left;   THYROIDECTOMY, PARTIAL  1998   TOTAL HIP ARTHROPLASTY Left 05/02/2012   Procedure: LEFT TOTAL HIP ARTHROPLASTY ANTERIOR APPROACH;  Surgeon: Shelda Pal, MD;  Location: [facility];  Service: Orthopedics;  Laterality: Left;   Patient Active Problem List   Diagnosis Date Noted   Calcific tendinitis 06/15/2022   Preoperative testing 12/26/2020   Chronic right hip pain 08/20/2020   BPH with urinary obstruction 10/17/2018   Gout 08/11/2016   Depression with anxiety 08/11/2016   HTN (hypertension) 08/11/2016   ADHD  (attention deficit hyperactivity disorder), inattentive type 08/11/2016   Hypothyroidism 08/11/2016   Environmental allergies 08/11/2016   GERD (gastroesophageal reflux disease) 08/11/2016   Overweight (BMI 25.0-29.9) 05/03/2012     REFERRING PROVIDER: Steward Drone, MD  REFERRING DIAG: S46.012A (ICD-10-CM) - Traumatic complete tear of left rotator cuff, initial encounter  S/p Lt biceps repair (cuff in tact per MD)  Rationale for Evaluation and Treatment: Rehabilitation  THERAPY DIAG:  Acute pain of left shoulder  Muscle weakness (generalized)  ONSET DATE: DOS 06/15/22  Days since surgery: 36    SUBJECTIVE:  SUBJECTIVE STATEMENT:  Pt states the shoulder was sore after last session but return to normal within a day. No pain since last session.  Eval:  Overall feeling really good. Reached into horizontal abduction and that was the final straw. I do have some discomfort in right shoulder. 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   PERTINENT HISTORY:  RC calcification, bil hip replacement, prior left knee injury  PAIN:  Are you having pain? No: NPRS scale: 0/10 Pain location: L shoulder  Pain description: grind, tooth ache  Aggravating factors: reaching  Relieving factors: getting arm back close to body   PRECAUTIONS:  None  WEIGHT BEARING RESTRICTIONS:  Yes POSTOPERATIVE PLAN: He will follow the biceps repair protocol.  He will be active range of motion overhead as tolerated.  FALLS:  Has patient fallen in last 6 months? No   OCCUPATION:  It consultant  PLOF:  Independent  PATIENT GOALS:  Move around and use arm normally   OBJECTIVE:   PATIENT SURVEYS:  FOTO  41  COGNITIVE STATUS: Within functional limits for tasks assessed   RED FLAGS: None    SENSATION: WFL  POSTURE:  EVAL: mild forward head  HAND DOMINANCE:  Right    Body Part #1 Shoulder  PALPATION: No s/s of infection at eval  UE ROM- LEFT UPPER EXTREMITY ROM:  EVAL- able to demo activation of all RC, flexion and abd to 45, full range available in left elbow 6/15: flexion 150, IR L3, ER T2   TREATMENT:       7/3  UBE L1 1 min fwd and retro; 4 mins total  Shoulder pulley ABD 2 min   Standing wall ball 2x20 CW and CCW  Doorway pec stretch 30s 3x (arm low) Standing ABD 1lb 2x10 GTB row 3x10 GTB ext 3x10  GTB ER bilat 2x10 GTB IR 3x10 YTB loop shoulder horizontal ABD wall walk 2x10  6/28  Pulley's 2 min ABD and flexion  Supine serratus punch 3x10 1lb (with ER on ascent) Standing wall ball 2x20 CW and CCW  S/L ER 3x10 1# S/L ABD 3x10 YTB row 3x10 1# YTB ext 3x10 1# Supine 1lb ABC 2x Supine flexion 2x10 1lb Wall push up 2x10  6/25  Pulley's 2 min ABD and flexion  S/L ER 3x8  S/L ABD 2x10 Prone row 3x10 1# Prone ext 3x10 1# Supine 1lb ABC 2x Supine flexion 3x10 (trialed 1 lb, pinching pain) Wall push up 2x10   07/09/22  Supine serratus punches 1# 2x15 Supine serratus circles 1# 2x12 B Supine serratus alphabet 2# 2 rounds with rest in between Sidelying GHJ ABD with cues for scap retraction/periscap activation x10 Sidelying GHJ flexion with cues for scap retraction/periscap activation x10 Doorway AAROM slides for flexion x10 with 3 second holds  Doorway AAROM sides for scaption x10 with 3 second holds  Standing scap retraction x15 with 2 second holds UEs on door Serratus punches with UEs on door x15 tactile cues for good SA activation/scap mobility   ~   6/15 STM subscap Resisted supine flexion 90-145 with assisted scap motion upon return Supine Swedish ABCs 1lb Sidelying GHJ abd with assist for scap motion Sidelying  protraction/retraction resist by PT Sidelying GHJ flexion to 90  Standing triceps kick, 1lb, Rt hand on table   6/5  PROM with LAD for joint relaxation  AAROM flexion to 110 2x10 Table slide flexion 2x10 Cane ABD 2x10 to 90  Wound inspect and bandage change- clean dry incision sites without draining or signs of erythema                                                                                                                          PATIENT EDUCATION:  Education details: Anatomy of condition, POC, HEP, exercise form/rationale Person educated: Patient Education method: Explanation, Demonstration, Tactile cues, Verbal cues, and Handouts Education comprehension: verbalized understanding, returned demonstration, verbal cues required, tactile cues required, and needs further education  HOME EXERCISE PROGRAM: [id number]   ASSESSMENT:  CLINICAL IMPRESSION:  Pt continued with progressive overload of scapular retraction type exercises without discomfort. Pt did have increase in weakness and fatigue with long lever, anti-gravity shoulder ABD which required longer rest breaks but no pain. Pt HEP updated today. Pt is 5 wks out and is progressing into strength phase of therapy. OH motion nearly symmetrical with stiffness into end range ABD. Plan to continue OKC strength progressing towards more antigravity CKC. Pt would benefit from continued skilled therapy in order to reach goals and maximize functional L UE strength and ROM for full return to PLOF.   OBJECTIVE IMPAIRMENTS: decreased activity tolerance, decreased ROM, decreased strength, increased muscle spasms, impaired flexibility, impaired UE functional use, improper body mechanics, and pain.   ACTIVITY LIMITATIONS: carrying, lifting, dressing, reach over head, and hygiene/grooming  PARTICIPATION LIMITATIONS: meal prep, cleaning, laundry, driving, shopping, community activity, and occupation  PERSONAL FACTORS: Time since onset of  injury/illness/exacerbation are also affecting patient's functional outcome.   REHAB POTENTIAL: Good  CLINICAL DECISION MAKING: Stable/uncomplicated  EVALUATION COMPLEXITY: Low   GOALS: Goals reviewed with patient? Yes  SHORT TERM GOALS: Target date: 6/22  GHJ flexion and abd to 90 against gravity with proper form Baseline: Goal status: achieved  2.  Completing HEP and ADLs pain <=3/10 Baseline:  Goal status:achieved   LONG TERM GOALS: Target date: POC date  Meet FOTO goal Baseline:  Goal status: INITIAL  2.  Able to reach all overhead objects without limitation by pain Baseline:  Goal status: INITIAL  3.  Gross GHJ strength 5/5 Baseline:  Goal status: INITIAL  4.  Able to complete all aDLs without limitation by pain Baseline:  Goal status: INITIAL   PLAN:  PT FREQUENCY: 1-2x/week  PT DURATION: through POC date  PLANNED INTERVENTIONS: Therapeutic exercises, Therapeutic activity, Neuromuscular re-education, Patient/Family education, Self Care, Joint mobilization, Aquatic Therapy, Dry Needling, Electrical stimulation, Spinal mobilization, Cryotherapy, Moist heat, scar mobilization, Taping, Traction, Ionotophoresis 4mg /ml Dexamethasone, Manual therapy, and Re-evaluation.  PLAN FOR NEXT SESSION: periscap strength/stability; going to [place] in about a month, adjust HEP/POC PRN   Zebedee Iba PT, DPT 07/21/22 1:55 PM

## 2022-07-29 ENCOUNTER — Ambulatory Visit (HOSPITAL_BASED_OUTPATIENT_CLINIC_OR_DEPARTMENT_OTHER): Payer: 59 | Admitting: Physical Therapy

## 2022-07-29 ENCOUNTER — Encounter (HOSPITAL_BASED_OUTPATIENT_CLINIC_OR_DEPARTMENT_OTHER): Payer: Self-pay | Admitting: Physical Therapy

## 2022-07-29 DIAGNOSIS — M25512 Pain in left shoulder: Secondary | ICD-10-CM | POA: Diagnosis not present

## 2022-07-29 DIAGNOSIS — M6281 Muscle weakness (generalized): Secondary | ICD-10-CM

## 2022-07-29 NOTE — Therapy (Signed)
OUTPATIENT PHYSICAL THERAPY TREATMENT   Patient Name: Patrick [name] MRN: [id number] DOB:1960-03-23, 62 y.o., male Today's Date: 07/29/2022  END OF SESSION:  PT End of Session - 07/29/22 0805     Visit Number 8    Number of Visits 13    Date for PT Re-Evaluation 08/13/22    Authorization Type AETNA    PT Start Time 0802    PT Stop Time 0840    PT Time Calculation (min) 38 min    Activity Tolerance Patient tolerated treatment well    Behavior During Therapy WFL for tasks assessed/performed                Past Medical History:  Diagnosis Date   ADHD (attention deficit hyperactivity disorder), inattentive type    sees Dr. Evelene Croon    Anxiety    sees Dr. Evelene Croon    Arthritis    DDD (degenerative disc disease), lumbar    Depression    sees Dr. Evelene Croon    Environmental allergies    GERD (gastroesophageal reflux disease)    History of blood transfusion    Hypertension    Hypothyroidism    partial thyroidectomy for benign nodule    Past Surgical History:  Procedure Laterality Date   BACK SURGERY Right 1985   microdiscectomy   COLONOSCOPY  02/19/2020   per Dr. Dulce Sellar, precancerous polyp, repeat in 3 yrs   SHOULDER ARTHROSCOPY WITH ROTATOR CUFF REPAIR Left 06/15/2022   Procedure: LEFT SHOULDER ARTHROSCOPY WITH ROTATOR CUFF REPAIR;  Surgeon: Huel Cote, MD;  Location: [facility];  Service: Orthopedics;  Laterality: Left;   THYROIDECTOMY, PARTIAL  1998   TOTAL HIP ARTHROPLASTY Left 05/02/2012   Procedure: LEFT TOTAL HIP ARTHROPLASTY ANTERIOR APPROACH;  Surgeon: Shelda Pal, MD;  Location: [facility];  Service: Orthopedics;  Laterality: Left;   Patient Active Problem List   Diagnosis Date Noted   Calcific tendinitis 06/15/2022   Preoperative testing 12/26/2020   Chronic right hip pain 08/20/2020   BPH with urinary obstruction 10/17/2018   Gout 08/11/2016   Depression with anxiety 08/11/2016   HTN (hypertension) 08/11/2016   ADHD (attention deficit  hyperactivity disorder), inattentive type 08/11/2016   Hypothyroidism 08/11/2016   Environmental allergies 08/11/2016   GERD (gastroesophageal reflux disease) 08/11/2016   Overweight (BMI 25.0-29.9) 05/03/2012     REFERRING PROVIDER: Steward Drone, MD  REFERRING DIAG: S46.012A (ICD-10-CM) - Traumatic complete tear of left rotator cuff, initial encounter  S/p Lt biceps repair (cuff in tact per MD)  Rationale for Evaluation and Treatment: Rehabilitation  THERAPY DIAG:  Acute pain of left shoulder  Muscle weakness (generalized)  ONSET DATE: DOS 06/15/22  Days since surgery: 44    SUBJECTIVE:  SUBJECTIVE STATEMENT:  Pt states that the shoulder abduction exercise causes soreness and is difficult but feels okay.   Eval:  Overall feeling really good. Reached into horizontal abduction and that was the final straw. I do have some discomfort in right shoulder. 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   PERTINENT HISTORY:  RC calcification, bil hip replacement, prior left knee injury  PAIN:  Are you having pain? No: NPRS scale: 0/10 Pain location: L shoulder  Pain description: grind, tooth ache  Aggravating factors: reaching  Relieving factors: getting arm back close to body   PRECAUTIONS:  None  WEIGHT BEARING RESTRICTIONS:  Yes POSTOPERATIVE PLAN: He will follow the biceps repair protocol.  He will be active range of motion overhead as tolerated.  FALLS:  Has patient fallen in last 6 months? No   OCCUPATION:  It consultant  PLOF:  Independent  PATIENT GOALS:  Move around and use arm normally   OBJECTIVE:   PATIENT SURVEYS:  FOTO 41  COGNITIVE STATUS: Within  functional limits for tasks assessed   RED FLAGS: None    SENSATION: WFL  POSTURE:  EVAL: mild forward head  HAND DOMINANCE:  Right    Body Part #1 Shoulder  PALPATION: No s/s of infection at eval  UE ROM- LEFT UPPER EXTREMITY ROM:  EVAL- able to demo activation of all RC, flexion and abd to 45, full range available in left elbow 6/15: flexion 150, IR L3, ER T2   TREATMENT:     7/11  UBE L1 2 min fwd and retro; 4 mins total  STM L infra, rear delt L GHJ post and inf glide grade III   Posterior shoulder stretch 30s 3x  Band OH lat stretch in door 30s 3x  OH triceps stretch 30s 3x   GTB triceps extension 3x10 GTB row in ABD 3x10 Wall push up 3x10     7/3  UBE L1 1 min fwd and retro; 4 mins total  Shoulder pulley ABD 2 min   Standing wall ball 2x20 CW and CCW  Doorway pec stretch 30s 3x (arm low) Standing ABD 1lb 2x10 GTB row 3x10 GTB ext 3x10  GTB ER bilat 2x10 GTB IR 3x10 YTB loop shoulder horizontal ABD wall walk 2x10  6/28  Pulley's 2 min ABD and flexion  Supine serratus punch 3x10 1lb (with ER on ascent) Standing wall ball 2x20 CW and CCW  S/L ER 3x10 1# S/L ABD 3x10 YTB row 3x10 1# YTB ext 3x10 1# Supine 1lb ABC 2x Supine flexion 2x10 1lb Wall push up 2x10  6/25  Pulley's 2 min ABD and flexion  S/L ER 3x8  S/L ABD 2x10 Prone row 3x10 1# Prone ext 3x10 1# Supine 1lb ABC 2x Supine flexion 3x10 (trialed 1 lb, pinching pain) Wall push up 2x10   07/09/22  Supine serratus punches 1# 2x15 Supine serratus circles 1# 2x12 B Supine serratus alphabet 2# 2 rounds with rest in between Sidelying GHJ ABD with cues for scap retraction/periscap activation x10 Sidelying GHJ flexion with cues for scap retraction/periscap activation x10 Doorway AAROM slides for flexion x10 with 3 second holds  Doorway AAROM sides for scaption x10 with 3 second holds  Standing scap retraction x15 with 2 second holds UEs on door Serratus punches with UEs on  door x15 tactile cues for good SA activation/scap mobility   ~  6/15 STM subscap Resisted supine flexion 90-145 with assisted scap motion upon return Supine Swedish ABCs 1lb Sidelying GHJ abd with assist for scap motion Sidelying protraction/retraction resist by PT Sidelying GHJ flexion to 90  Standing triceps kick, 1lb, Rt hand on table   6/5  PROM with LAD for joint relaxation  AAROM flexion to 110 2x10 Table slide flexion 2x10 Cane ABD 2x10 to 90  Wound inspect and bandage change- clean dry incision sites without draining or signs of erythema                                                                                                                          PATIENT EDUCATION:  Education details: Anatomy of condition, POC, HEP, exercise form/rationale Person educated: Patient Education method: Explanation, Demonstration, Tactile cues, Verbal cues, and Handouts Education comprehension: verbalized understanding, returned demonstration, verbal cues required, tactile cues required, and needs further education  HOME EXERCISE PROGRAM: [id number]   ASSESSMENT:  CLINICAL IMPRESSION:  Pt is 6 wks at this time. Pt does present with posterior shoulder tightness with recent increase in exercise and activity. Pt provided with posterior shoulder and triceps stretching for the shoulder that did improve discomfort with movement. Pt HEP progressed today to include CKC with good tolerance and stability. Plan for visit next session to progress HEP for pt's month long trip out of country. Pt would benefit from continued skilled therapy in order to reach goals and maximize functional L UE strength and ROM for full return to PLOF.   OBJECTIVE IMPAIRMENTS: decreased activity tolerance, decreased ROM, decreased strength, increased muscle spasms, impaired flexibility, impaired UE functional use, improper body mechanics, and pain.   ACTIVITY LIMITATIONS: carrying, lifting, dressing, reach over  head, and hygiene/grooming  PARTICIPATION LIMITATIONS: meal prep, cleaning, laundry, driving, shopping, community activity, and occupation  PERSONAL FACTORS: Time since onset of injury/illness/exacerbation are also affecting patient's functional outcome.   REHAB POTENTIAL: Good  CLINICAL DECISION MAKING: Stable/uncomplicated  EVALUATION COMPLEXITY: Low   GOALS: Goals reviewed with patient? Yes  SHORT TERM GOALS: Target date: 6/22  GHJ flexion and abd to 90 against gravity with proper form Baseline: Goal status: achieved  2.  Completing HEP and ADLs pain <=3/10 Baseline:  Goal status:achieved   LONG TERM GOALS: Target date: POC date  Meet FOTO goal Baseline:  Goal status: INITIAL  2.  Able to reach all overhead objects without limitation by pain Baseline:  Goal status: INITIAL  3.  Gross GHJ strength 5/5 Baseline:  Goal status: INITIAL  4.  Able to complete all aDLs without limitation by pain Baseline:  Goal status: INITIAL   PLAN:  PT FREQUENCY: 1-2x/week  PT DURATION: through POC date  PLANNED INTERVENTIONS: Therapeutic exercises, Therapeutic activity, Neuromuscular re-education, Patient/Family education, Self Care, Joint mobilization, Aquatic Therapy, Dry Needling, Electrical stimulation, Spinal mobilization, Cryotherapy, Moist heat, scar mobilization, Taping, Traction, Ionotophoresis 4mg /ml Dexamethasone, Manual therapy, and Re-evaluation.  PLAN FOR NEXT SESSION: periscap strength/stability; going to [place] in about  a month, adjust HEP/POC PRN   Zebedee Iba PT, DPT 07/29/22 8:37 AM

## 2022-07-31 ENCOUNTER — Other Ambulatory Visit: Payer: Self-pay | Admitting: Family Medicine

## 2022-08-04 ENCOUNTER — Ambulatory Visit (INDEPENDENT_AMBULATORY_CARE_PROVIDER_SITE_OTHER): Payer: 59 | Admitting: Orthopaedic Surgery

## 2022-08-04 DIAGNOSIS — S46012A Strain of muscle(s) and tendon(s) of the rotator cuff of left shoulder, initial encounter: Secondary | ICD-10-CM

## 2022-08-04 NOTE — Progress Notes (Signed)
Chief Complaint: Left shoulder arthroscopic debridement and biceps tenodesis 5/28     History of Present Illness:   08/04/2022:   Patrick [name] is a 62 y.o. male presents status post the above procedure.  He is now 6 weeks out.  He is overall doing extremely well.  He has some soreness with overhead activity although this is very mild.  He has been working with Hessie Diener and physical therapy.    Surgical History:   none  PMH/PSH/Family History/Social History/Meds/Allergies:    Past Medical History:  Diagnosis Date   ADHD (attention deficit hyperactivity disorder), inattentive type    sees Dr. Evelene Croon    Anxiety    sees Dr. Evelene Croon    Arthritis    DDD (degenerative disc disease), lumbar    Depression    sees Dr. Evelene Croon    Environmental allergies    GERD (gastroesophageal reflux disease)    History of blood transfusion    Hypertension    Hypothyroidism    partial thyroidectomy for benign nodule    Past Surgical History:  Procedure Laterality Date   BACK SURGERY Right 1985   microdiscectomy   COLONOSCOPY  02/19/2020   per Dr. Dulce Sellar, precancerous polyp, repeat in 3 yrs   SHOULDER ARTHROSCOPY WITH ROTATOR CUFF REPAIR Left 06/15/2022   Procedure: LEFT SHOULDER ARTHROSCOPY WITH ROTATOR CUFF REPAIR;  Surgeon: Huel Cote, MD;  Location: [facility];  Service: Orthopedics;  Laterality: Left;   THYROIDECTOMY, PARTIAL  1998   TOTAL HIP ARTHROPLASTY Left 05/02/2012   Procedure: LEFT TOTAL HIP ARTHROPLASTY ANTERIOR APPROACH;  Surgeon: Shelda Pal, MD;  Location: [facility];  Service: Orthopedics;  Laterality: Left;   Social History   Socioeconomic History   Marital status: Married    Spouse name: Not on file   Number of children: Not on file   Years of education: Not on file   Highest education level: Not on file  Occupational History   Not on file  Tobacco Use   Smoking status: Former    Current packs/day: 0.00    Types:  Cigarettes    Quit date: 04/27/2002    Years since quitting: 20.2   Smokeless tobacco: Former    Quit date: 04/27/2002  Substance and Sexual Activity   Alcohol use: Yes    Comment: 3-4 glasses wine/beer  week   Drug use: No   Sexual activity: Not on file  Other Topics Concern   Not on file  Social History Narrative   Not on file   Social Determinants of Health   Financial Resource Strain: Not on file  Food Insecurity: No Food Insecurity (01/06/2022)   Hunger Vital Sign    Worried About Running Out of Food in the Last Year: Never true    Ran Out of Food in the Last Year: Never true  Transportation Needs: No Transportation Needs (01/06/2022)   PRAPARE - Administrator, Civil Service (Medical): No    Lack of Transportation (Non-Medical): No  Physical Activity: Not on file  Stress: Not on file  Social Connections: Not on file   No family history on file. No Active Allergies  Current Outpatient Medications  Medication Sig Dispense Refill   allopurinol (ZYLOPRIM) 100 MG tablet TAKE 1 TABLET BY MOUTH EVERY DAY 90 tablet 0  amLODipine (NORVASC) 5 MG tablet TAKE 1 TABLET BY MOUTH TWICE DAILY 180 tablet 0   amphetamine-dextroamphetamine (ADDERALL XR) 30 MG 24 hr capsule Take 1 capsule (30 mg total) by mouth every morning. 30 capsule 0   aspirin EC 325 MG tablet Take 1 tablet (325 mg total) by mouth daily. 14 tablet 0   FLUoxetine (PROZAC) 40 MG capsule Take 40 mg by mouth daily.     levothyroxine (SYNTHROID) 50 MCG tablet TAKE 1 TABLET(50 MCG) BY MOUTH DAILY BEFORE BREAKFAST 90 tablet 0   loratadine (CLARITIN) 10 MG tablet Take 10 mg by mouth daily.     losartan (COZAAR) 100 MG tablet TAKE 1 TABLET BY MOUTH EVERY DAY 90 tablet 0   omeprazole (PRILOSEC) 20 MG capsule TAKE 1 CAPSULE BY MOUTH DAILY 90 capsule 3   propranolol ER (INDERAL LA) 80 MG 24 hr capsule TAKE 1 CAPSULE BY MOUTH DAILY BEFORE BREAKFAST 90 capsule 0   tamsulosin (FLOMAX) 0.4 MG CAPS capsule TAKE 1  CAPSULE(0.4 MG) BY MOUTH DAILY 30 capsule 3   No current facility-administered medications for this visit.   No results found.  Review of Systems:   A ROS was performed including pertinent positives and negatives as documented in the HPI.  Physical Exam :   Constitutional: NAD and appears stated age Neurological: Alert and oriented Psych: Appropriate affect and cooperative There were no vitals taken for this visit.   Comprehensive Musculoskeletal Exam:    Left shoulder incisions are well-appearing without erythema or drainage.  He has active forward elevation to 170 degrees bilaterally.  External rotation at the side is 40 degrees.  Internal rotation is to L1 bilaterally    Imaging:     I personally reviewed and interpreted the radiographs.   Assessment:   62 y.o. male 6 weeks status post left shoulder calcific tendinitis debridement and biceps tenodesis.  Overall doing very well.  He will continue to work on range of motion and strengthening.  I will plan to see him back in 6 weeks for final check  Plan :    -Return to clinic in 6 weeks for reassessment  I personally saw and evaluated the patient, and participated in the management and treatment plan.  Huel Cote, MD Attending Physician, Orthopedic Surgery  This document was dictated using Dragon voice recognition software. A reasonable attempt at proof reading has been made to minimize errors.

## 2022-08-05 ENCOUNTER — Ambulatory Visit (HOSPITAL_BASED_OUTPATIENT_CLINIC_OR_DEPARTMENT_OTHER): Payer: 59 | Admitting: Physical Therapy

## 2022-08-05 ENCOUNTER — Encounter (HOSPITAL_BASED_OUTPATIENT_CLINIC_OR_DEPARTMENT_OTHER): Payer: Self-pay | Admitting: Physical Therapy

## 2022-08-05 DIAGNOSIS — M25512 Pain in left shoulder: Secondary | ICD-10-CM | POA: Diagnosis not present

## 2022-08-05 DIAGNOSIS — M6281 Muscle weakness (generalized): Secondary | ICD-10-CM

## 2022-08-05 NOTE — Therapy (Signed)
OUTPATIENT PHYSICAL THERAPY TREATMENT   Patient Name: Patrick [name] MRN: [id number] DOB:March 19, 1960, 62 y.o., male Today's Date: 08/05/2022  END OF SESSION:  PT End of Session - 08/05/22 1322     Visit Number 9    Number of Visits 13    Date for PT Re-Evaluation 08/13/22    Authorization Type AETNA    PT Start Time 1322    PT Stop Time 1350    PT Time Calculation (min) 28 min    Activity Tolerance Patient tolerated treatment well    Behavior During Therapy WFL for tasks assessed/performed                Past Medical History:  Diagnosis Date   ADHD (attention deficit hyperactivity disorder), inattentive type    sees Dr. Evelene Croon    Anxiety    sees Dr. Evelene Croon    Arthritis    DDD (degenerative disc disease), lumbar    Depression    sees Dr. Evelene Croon    Environmental allergies    GERD (gastroesophageal reflux disease)    History of blood transfusion    Hypertension    Hypothyroidism    partial thyroidectomy for benign nodule    Past Surgical History:  Procedure Laterality Date   BACK SURGERY Right 1985   microdiscectomy   COLONOSCOPY  02/19/2020   per Dr. Dulce Sellar, precancerous polyp, repeat in 3 yrs   SHOULDER ARTHROSCOPY WITH ROTATOR CUFF REPAIR Left 06/15/2022   Procedure: LEFT SHOULDER ARTHROSCOPY WITH ROTATOR CUFF REPAIR;  Surgeon: Huel Cote, MD;  Location: [facility];  Service: Orthopedics;  Laterality: Left;   THYROIDECTOMY, PARTIAL  1998   TOTAL HIP ARTHROPLASTY Left 05/02/2012   Procedure: LEFT TOTAL HIP ARTHROPLASTY ANTERIOR APPROACH;  Surgeon: Shelda Pal, MD;  Location: [facility];  Service: Orthopedics;  Laterality: Left;   Patient Active Problem List   Diagnosis Date Noted   Calcific tendinitis 06/15/2022   Preoperative testing 12/26/2020   Chronic right hip pain 08/20/2020   BPH with urinary obstruction 10/17/2018   Gout 08/11/2016   Depression with anxiety 08/11/2016   HTN (hypertension) 08/11/2016   ADHD (attention deficit  hyperactivity disorder), inattentive type 08/11/2016   Hypothyroidism 08/11/2016   Environmental allergies 08/11/2016   GERD (gastroesophageal reflux disease) 08/11/2016   Overweight (BMI 25.0-29.9) 05/03/2012     REFERRING PROVIDER: Steward Drone, MD  REFERRING DIAG: S46.012A (ICD-10-CM) - Traumatic complete tear of left rotator cuff, initial encounter  S/p Lt biceps repair (cuff in tact per MD)  Rationale for Evaluation and Treatment: Rehabilitation  THERAPY DIAG:  Acute pain of left shoulder  Muscle weakness (generalized)  ONSET DATE: DOS 06/15/22  Days since surgery: 51    SUBJECTIVE:  SUBJECTIVE STATEMENT:  Pt has some mild soreness overhead but otherwise no issues.   Eval:  Overall feeling really good. Reached into horizontal abduction and that was the final straw. I do have some discomfort in right shoulder. 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   PERTINENT HISTORY:  RC calcification, bil hip replacement, prior left knee injury  PAIN:  Are you having pain? No: NPRS scale: 0/10 Pain location: L shoulder  Pain description: grind, tooth ache  Aggravating factors: reaching  Relieving factors: getting arm back close to body   PRECAUTIONS:  None  WEIGHT BEARING RESTRICTIONS:  Yes POSTOPERATIVE PLAN: He will follow the biceps repair protocol.  He will be active range of motion overhead as tolerated.  FALLS:  Has patient fallen in last 6 months? No   OCCUPATION:  It consultant  PLOF:  Independent  PATIENT GOALS:  Move around and use arm normally   OBJECTIVE:   PATIENT SURVEYS:  FOTO 41  COGNITIVE STATUS: Within functional limits for tasks  assessed   RED FLAGS: None    SENSATION: WFL  POSTURE:  EVAL: mild forward head  HAND DOMINANCE:  Right    Body Part #1 Shoulder  PALPATION: No s/s of infection at eval  UE ROM- LEFT UPPER EXTREMITY ROM:  EVAL- able to demo activation of all RC, flexion and abd to 45, full range available in left elbow 6/15: flexion 150, IR L3, ER T2   TREATMENT:    7/18  UBE L1 2 min fwd and retro; 4 mins total  Bicep curl 3lbs 3x8-10 Flexion and ABD wall slide 3x8 Blue TB row 3x10 Cane ext 5s 10x Counter push up  Exercise progression, regression, precautions while traveling    7/11  UBE L1 2 min fwd and retro; 4 mins total  STM L infra, rear delt L GHJ post and inf glide grade III   Posterior shoulder stretch 30s 3x  Band OH lat stretch in door 30s 3x  OH triceps stretch 30s 3x   GTB triceps extension 3x10 GTB row in ABD 3x10 Wall push up 3x10     7/3  UBE L1 1 min fwd and retro; 4 mins total  Shoulder pulley ABD 2 min   Standing wall ball 2x20 CW and CCW  Doorway pec stretch 30s 3x (arm low) Standing ABD 1lb 2x10 GTB row 3x10 GTB ext 3x10  GTB ER bilat 2x10 GTB IR 3x10 YTB loop shoulder horizontal ABD wall walk 2x10  6/28  Pulley's 2 min ABD and flexion  Supine serratus punch 3x10 1lb (with ER on ascent) Standing wall ball 2x20 CW and CCW  S/L ER 3x10 1# S/L ABD 3x10 YTB row 3x10 1# YTB ext 3x10 1# Supine 1lb ABC 2x Supine flexion 2x10 1lb Wall push up 2x10  6/25  Pulley's 2 min ABD and flexion  S/L ER 3x8  S/L ABD 2x10 Prone row 3x10 1# Prone ext 3x10 1# Supine 1lb ABC 2x Supine flexion 3x10 (trialed 1 lb, pinching pain) Wall push up 2x10   07/09/22  Supine serratus punches 1# 2x15 Supine serratus circles 1# 2x12 B Supine serratus alphabet 2# 2 rounds with rest in between Sidelying GHJ ABD with cues for scap retraction/periscap activation x10 Sidelying GHJ flexion with cues for scap retraction/periscap activation x10 Doorway  AAROM slides for flexion x10 with 3 second  holds  Doorway AAROM sides for scaption x10 with 3 second holds  Standing scap retraction x15 with 2 second holds UEs on door Serratus punches with UEs on door x15 tactile cues for good SA activation/scap mobility   ~   6/15 STM subscap Resisted supine flexion 90-145 with assisted scap motion upon return Supine Swedish ABCs 1lb Sidelying GHJ abd with assist for scap motion Sidelying protraction/retraction resist by PT Sidelying GHJ flexion to 90  Standing triceps kick, 1lb, Rt hand on table   6/5  PROM with LAD for joint relaxation  AAROM flexion to 110 2x10 Table slide flexion 2x10 Cane ABD 2x10 to 90  Wound inspect and bandage change- clean dry incision sites without draining or signs of erythema                                                                                                                          PATIENT EDUCATION:  Education details: Anatomy of condition, POC, HEP, exercise form/rationale Person educated: Patient Education method: Explanation, Demonstration, Tactile cues, Verbal cues, and Handouts Education comprehension: verbalized understanding, returned demonstration, verbal cues required, tactile cues required, and needs further education  HOME EXERCISE PROGRAM: [id number]   ASSESSMENT:  CLINICAL IMPRESSION:  Pt is 7 wks at this time.  Patient exercise session focused on education regarding progression, regression, and precautions with exercise as patient is traveling.  Patient was able to increase intensity of scapular exercise as well as moving into overhead positions today without discomfort.  Patient does have pain with internal rotation so progressed to just extension-based exercise.  Patient was advised to increase intensity and volume with exercises as they become easy.  However, overhead motions should be kept at moderate weight until patient returns to therapy.  HEP printout as well as progressive  band colors provided with education given on modifications.  Plan for patient to return in 1 month for recheck.  Pt would benefit from continued skilled therapy in order to reach goals and maximize functional L UE strength and ROM for full return to PLOF.   OBJECTIVE IMPAIRMENTS: decreased activity tolerance, decreased ROM, decreased strength, increased muscle spasms, impaired flexibility, impaired UE functional use, improper body mechanics, and pain.   ACTIVITY LIMITATIONS: carrying, lifting, dressing, reach over head, and hygiene/grooming  PARTICIPATION LIMITATIONS: meal prep, cleaning, laundry, driving, shopping, community activity, and occupation  PERSONAL FACTORS: Time since onset of injury/illness/exacerbation are also affecting patient's functional outcome.   REHAB POTENTIAL: Good  CLINICAL DECISION MAKING: Stable/uncomplicated  EVALUATION COMPLEXITY: Low   GOALS: Goals reviewed with patient? Yes  SHORT TERM GOALS: Target date: 6/22  GHJ flexion and abd to 90 against gravity with proper form Baseline: Goal status: achieved  2.  Completing HEP and ADLs pain <=3/10 Baseline:  Goal status:achieved   LONG TERM GOALS: Target date: POC date  Meet FOTO goal Baseline:  Goal status: INITIAL  2.  Able to reach all overhead objects without limitation by pain Baseline:  Goal  status: INITIAL  3.  Gross GHJ strength 5/5 Baseline:  Goal status: INITIAL  4.  Able to complete all aDLs without limitation by pain Baseline:  Goal status: INITIAL   PLAN:  PT FREQUENCY: 1-2x/week  PT DURATION: through POC date  PLANNED INTERVENTIONS: Therapeutic exercises, Therapeutic activity, Neuromuscular re-education, Patient/Family education, Self Care, Joint mobilization, Aquatic Therapy, Dry Needling, Electrical stimulation, Spinal mobilization, Cryotherapy, Moist heat, scar mobilization, Taping, Traction, Ionotophoresis 4mg /ml Dexamethasone, Manual therapy, and Re-evaluation.  PLAN  FOR NEXT SESSION: periscap strength/stability; going to [place] in about a month, adjust HEP/POC PRN   Zebedee Iba PT, DPT 08/05/22 1:57 PM

## 2022-08-25 ENCOUNTER — Other Ambulatory Visit: Payer: Self-pay | Admitting: Family Medicine

## 2022-08-26 ENCOUNTER — Encounter (HOSPITAL_BASED_OUTPATIENT_CLINIC_OR_DEPARTMENT_OTHER): Payer: Self-pay | Admitting: Physical Therapy

## 2022-08-26 ENCOUNTER — Ambulatory Visit (HOSPITAL_BASED_OUTPATIENT_CLINIC_OR_DEPARTMENT_OTHER): Payer: 59 | Attending: Orthopaedic Surgery | Admitting: Physical Therapy

## 2022-08-26 ENCOUNTER — Other Ambulatory Visit: Payer: Self-pay

## 2022-08-26 DIAGNOSIS — M6281 Muscle weakness (generalized): Secondary | ICD-10-CM | POA: Diagnosis present

## 2022-08-26 DIAGNOSIS — M25512 Pain in left shoulder: Secondary | ICD-10-CM | POA: Diagnosis present

## 2022-08-26 NOTE — Therapy (Signed)
OUTPATIENT PHYSICAL THERAPY TREATMENT   Patient Name: Patrick [name] MRN: [id number] DOB:November 23, 1960, 62 y.o., male Today's Date: 08/26/2022  END OF SESSION:  PT End of Session - 08/26/22 0845     Visit Number 10    Number of Visits 21    Date for PT Re-Evaluation 11/24/22    Authorization Type AETNA    PT Start Time 0804    PT Stop Time 0843    PT Time Calculation (min) 39 min    Activity Tolerance Patient tolerated treatment well    Behavior During Therapy WFL for tasks assessed/performed                 Past Medical History:  Diagnosis Date   ADHD (attention deficit hyperactivity disorder), inattentive type    sees Dr. Evelene Croon    Anxiety    sees Dr. Evelene Croon    Arthritis    DDD (degenerative disc disease), lumbar    Depression    sees Dr. Evelene Croon    Environmental allergies    GERD (gastroesophageal reflux disease)    History of blood transfusion    Hypertension    Hypothyroidism    partial thyroidectomy for benign nodule    Past Surgical History:  Procedure Laterality Date   BACK SURGERY Right 1985   microdiscectomy   COLONOSCOPY  02/19/2020   per Dr. Dulce Sellar, precancerous polyp, repeat in 3 yrs   SHOULDER ARTHROSCOPY WITH ROTATOR CUFF REPAIR Left 06/15/2022   Procedure: LEFT SHOULDER ARTHROSCOPY WITH ROTATOR CUFF REPAIR;  Surgeon: Huel Cote, MD;  Location: [facility];  Service: Orthopedics;  Laterality: Left;   THYROIDECTOMY, PARTIAL  1998   TOTAL HIP ARTHROPLASTY Left 05/02/2012   Procedure: LEFT TOTAL HIP ARTHROPLASTY ANTERIOR APPROACH;  Surgeon: Shelda Pal, MD;  Location: [facility];  Service: Orthopedics;  Laterality: Left;   Patient Active Problem List   Diagnosis Date Noted   Calcific tendinitis 06/15/2022   Preoperative testing 12/26/2020   Chronic right hip pain 08/20/2020   BPH with urinary obstruction 10/17/2018   Gout 08/11/2016   Depression with anxiety 08/11/2016   HTN (hypertension) 08/11/2016   ADHD (attention deficit  hyperactivity disorder), inattentive type 08/11/2016   Hypothyroidism 08/11/2016   Environmental allergies 08/11/2016   GERD (gastroesophageal reflux disease) 08/11/2016   Overweight (BMI 25.0-29.9) 05/03/2012     REFERRING PROVIDER: Steward Drone, MD  REFERRING DIAG: S46.012A (ICD-10-CM) - Traumatic complete tear of left rotator cuff, initial encounter  S/p Lt biceps repair (cuff in tact per MD)  Rationale for Evaluation and Treatment: Rehabilitation  THERAPY DIAG:  Acute pain of left shoulder  Muscle weakness (generalized)  ONSET DATE: DOS 06/15/22  Days since surgery: 72    SUBJECTIVE:  SUBJECTIVE STATEMENT:  Pt returns from 1 month of travel. Feels shoulder is stiff. While traveling, baggage was delayed and he did not have his bands to perform strengthening. Was able to do BW type exercise instead. Feels pinch going OH  Eval:  Overall feeling really good. Reached into horizontal abduction and that was the final straw. I do have some discomfort in right shoulder. 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   PERTINENT HISTORY:  RC calcification, bil hip replacement, prior left knee injury  PAIN:  Are you having pain? No: NPRS scale: 0/10 Pain location: L shoulder  Pain description: grind, tooth ache  Aggravating factors: reaching  Relieving factors: getting arm back close to body   PRECAUTIONS:  None  WEIGHT BEARING RESTRICTIONS:  Yes POSTOPERATIVE PLAN: He will follow the biceps repair protocol.  He will be active range of motion overhead as tolerated.  FALLS:  Has patient fallen in last 6 months? No   OCCUPATION:  It consultant  PLOF:  Independent  PATIENT  GOALS:  Move around and use arm normally   OBJECTIVE:   PATIENT SURVEYS:  FOTO 41  FOTO 8/8    COGNITIVE STATUS: Within functional limits for tasks assessed   RED FLAGS: None    SENSATION: WFL  POSTURE:  EVAL: mild forward head  HAND DOMINANCE:  Right   AROM Right 8/8  Left 8/8  Shoulder flexion 160 155 p!  Shoulder extension 45 45  Shoulder abduction 155 145 p! At 90  Shoulder adduction    Shoulder internal rotation 70 60  Shoulder external rotation 80 70  Elbow flexion    Elbow extension    Wrist flexion    Wrist extension    Wrist ulnar deviation    Wrist radial deviation    Wrist pronation    Wrist supination    (Blank rows = not tested)     MMT  HHD in lbs  Right 8/8  Left 8/8  Shoulder flexion 45.8 21.5  Shoulder extension 53.0 48.2  Shoulder abduction 45.5 12.6  Shoulder adduction    Shoulder internal rotation 35.9 31.6  Shoulder external rotation 26.0 15.2  Elbow flexion    Elbow extension    Wrist flexion    Wrist extension    Wrist ulnar deviation    Wrist radial deviation    Wrist pronation    Wrist supination    (Blank rows = not tested)   TREATMENT:    8/8  L GHJ inf and post mob grade III-IV  Exam findings, current strength deficits, mechanics of shoulder and pain reaching OH  Exercises - Standing Shoulder Posterior Capsule Stretch  - 1 x daily - 7 x weekly - 1 sets - 3 reps - 30 hold - Standing Shoulder Extension with Dowel  - 1 x daily - 7 x weekly - 1 sets - 10 reps - 5 hold - Standing Overhead Triceps Stretch  - 1 x daily - 7 x weekly - 1 sets - 3 reps - 30 hold - Standing Shoulder Row with Blue TB Anchored Resistance  single arm 3 sets - 10 reps - Shoulder Abduction with Dumbbells - Thumbs Up  - 1 x daily - 4-5 x weekly - 2 sets - 10 reps - Shoulder External Rotation and Scapular Retraction with Resistance  - 1 x daily - 4-5  x weekly - 2 sets - 10 reps - Standing Bicep Curls Supinated with Dumbbells  - 1 x daily  - 4-5 x weekly - 3 sets - 10 reps - Push Up on Table  - 1 x daily - 4-5 x weekly - 3 sets - 8 reps  7/18  UBE L1 2 min fwd and retro; 4 mins total  Bicep curl 3lbs 3x8-10 Flexion and ABD wall slide 3x8 Blue TB row 3x10 Cane ext 5s 10x Counter push up  Exercise progression, regression, precautions while traveling    7/11  UBE L1 2 min fwd and retro; 4 mins total  STM L infra, rear delt L GHJ post and inf glide grade III   Posterior shoulder stretch 30s 3x  Band OH lat stretch in door 30s 3x  OH triceps stretch 30s 3x   GTB triceps extension 3x10 GTB row in ABD 3x10 Wall push up 3x10                                                                                                                              PATIENT EDUCATION:  Education details: Teacher, music of condition, POC, HEP, exercise form/rationale Person educated: Patient Education method: Explanation, Demonstration, Tactile cues, Verbal cues, and Handouts Education comprehension: verbalized understanding, returned demonstration, verbal cues required, tactile cues required, and needs further education  HOME EXERCISE PROGRAM: [id number]   ASSESSMENT:  CLINICAL IMPRESSION:  Pt is 10 wks at this time and returns from long-term travel. During re-assessment, pt with "pinching" type pain with OH reaching with UT compensation. Pt is also less than 50% strength of the R shoulder at this time. Pt had improvement in pain with normalized ROM following manual therapy suggesting joint/capsular stiffness. Pt advised to progress weight/intensity of all shoulder exercise at this time as strength is now his focus. Plan to continue with therapy at 1x/week to perform joint mobs PRN as well as progress home exercise. Pt advised that 80-90% strength with HHD is the goal. Pt would benefit from continued skilled therapy in order to reach goals and maximize functional L UE strength and ROM for full return to PLOF.   OBJECTIVE IMPAIRMENTS:  decreased activity tolerance, decreased ROM, decreased strength, increased muscle spasms, impaired flexibility, impaired UE functional use, improper body mechanics, and pain.   ACTIVITY LIMITATIONS: carrying, lifting, dressing, reach over head, and hygiene/grooming  PARTICIPATION LIMITATIONS: meal prep, cleaning, laundry, driving, shopping, community activity, and occupation  PERSONAL FACTORS: Time since onset of injury/illness/exacerbation are also affecting patient's functional outcome.   REHAB POTENTIAL: Good  CLINICAL DECISION MAKING: Stable/uncomplicated  EVALUATION COMPLEXITY: Low   GOALS: Goals reviewed with patient? Yes  SHORT TERM GOALS: Target date: 6/22  GHJ flexion and abd to 90 against gravity with proper form Baseline: Goal status: achieved  2.  Completing HEP and ADLs pain <=3/10 Baseline:  Goal status:achieved   LONG TERM GOALS: Target date: POC date  Meet FOTO goal Baseline:  Goal  status: ongoing  2.  Able to reach all overhead objects without limitation by pain Baseline:  Goal status: ongoing  3.  Gross GHJ strength 5/5 Baseline:  Goal status: ongoing  4.  Able to complete all aDLs without limitation by pain Baseline:  Goal status: ongoing   PLAN:  PT FREQUENCY: 1x/week  PT DURATION: through POC date  PLANNED INTERVENTIONS: Therapeutic exercises, Therapeutic activity, Neuromuscular re-education, Patient/Family education, Self Care, Joint mobilization, Aquatic Therapy, Dry Needling, Electrical stimulation, Spinal mobilization, Cryotherapy, Moist heat, scar mobilization, Taping, Traction, Ionotophoresis 4mg /ml Dexamethasone, Manual therapy, and Re-evaluation.  PLAN FOR NEXT SESSION: periscap strength/stability; joint mobs PRN  Zebedee Iba PT, DPT 08/26/22 8:46 AM

## 2022-08-27 ENCOUNTER — Other Ambulatory Visit: Payer: Self-pay | Admitting: Family Medicine

## 2022-09-03 ENCOUNTER — Other Ambulatory Visit: Payer: Self-pay | Admitting: Family Medicine

## 2022-09-05 NOTE — Therapy (Signed)
OUTPATIENT PHYSICAL THERAPY TREATMENT   Patient Name: Patrick [name] MRN: [id number] DOB:07-07-1960, 62 y.o., male Today's Date: 09/07/2022  END OF SESSION:  PT End of Session - 09/06/22 0937     Visit Number 11    Number of Visits 21    Date for PT Re-Evaluation 11/24/22    Authorization Type AETNA    PT Start Time 0858    PT Stop Time 0934    PT Time Calculation (min) 36 min    Activity Tolerance Patient tolerated treatment well    Behavior During Therapy WFL for tasks assessed/performed                  Past Medical History:  Diagnosis Date   ADHD (attention deficit hyperactivity disorder), inattentive type    sees Dr. Evelene Croon    Anxiety    sees Dr. Evelene Croon    Arthritis    DDD (degenerative disc disease), lumbar    Depression    sees Dr. Evelene Croon    Environmental allergies    GERD (gastroesophageal reflux disease)    History of blood transfusion    Hypertension    Hypothyroidism    partial thyroidectomy for benign nodule    Past Surgical History:  Procedure Laterality Date   BACK SURGERY Right 1985   microdiscectomy   COLONOSCOPY  02/19/2020   per Dr. Dulce Sellar, precancerous polyp, repeat in 3 yrs   SHOULDER ARTHROSCOPY WITH ROTATOR CUFF REPAIR Left 06/15/2022   Procedure: LEFT SHOULDER ARTHROSCOPY WITH ROTATOR CUFF REPAIR;  Surgeon: Huel Cote, MD;  Location: [facility];  Service: Orthopedics;  Laterality: Left;   THYROIDECTOMY, PARTIAL  1998   TOTAL HIP ARTHROPLASTY Left 05/02/2012   Procedure: LEFT TOTAL HIP ARTHROPLASTY ANTERIOR APPROACH;  Surgeon: Shelda Pal, MD;  Location: [facility];  Service: Orthopedics;  Laterality: Left;   Patient Active Problem List   Diagnosis Date Noted   Calcific tendinitis 06/15/2022   Preoperative testing 12/26/2020   Chronic right hip pain 08/20/2020   BPH with urinary obstruction 10/17/2018   Gout 08/11/2016   Depression with anxiety 08/11/2016   HTN (hypertension) 08/11/2016   ADHD (attention  deficit hyperactivity disorder), inattentive type 08/11/2016   Hypothyroidism 08/11/2016   Environmental allergies 08/11/2016   GERD (gastroesophageal reflux disease) 08/11/2016   Overweight (BMI 25.0-29.9) 05/03/2012     REFERRING PROVIDER: Steward Drone, MD  REFERRING DIAG: S46.012A (ICD-10-CM) - Traumatic complete tear of left rotator cuff, initial encounter  S/p Lt biceps repair (cuff in tact per MD)  Rationale for Evaluation and Treatment: Rehabilitation  THERAPY DIAG:  Acute pain of left shoulder  Muscle weakness (generalized)  ONSET DATE: DOS 06/15/22  Days since surgery: 83    SUBJECTIVE:  SUBJECTIVE STATEMENT:  Pt is 11 weeks and 6 days post op.  Pt has pain and difficulty with reaching out laterally, though other motions are doing well.  Pt reports he's been trying to do his HEP.  Pt states most of the time he doesn't feel anything, but can have a 8/10.  Pt denies any adverse effects, just had some soreness.       PERTINENT HISTORY:  RC calcification, bil hip replacement, prior left knee injury  PAIN:  Are you having pain? No: NPRS scale:  2/10 Pain location: L shoulder  Pain description: grind, tooth ache  Aggravating factors: reaching  Relieving factors: getting arm back close to body   PRECAUTIONS:  None  WEIGHT BEARING RESTRICTIONS:  Yes POSTOPERATIVE PLAN: He will follow the biceps repair protocol.  He will be active range of motion overhead as tolerated.  FALLS:  Has patient fallen in last 6 months? No   OCCUPATION:  It consultant  PLOF:  Independent  PATIENT GOALS:  Move around and use arm normally   OBJECTIVE:    TREATMENT:    8/19  UBE L1 2 min fwd and retro; 4 mins total Shoulder flex and abd wall slide x 8 reps each Standing wand abd x 10  reps Blue TB row 3x10 Bilat ER with retraction with GTB 2x10 Push up on table (table elevated) x 8 reps Bicep curl 3lbs 3x10 Post capsule stretch 3x30 sec  L GHJ inf and post mob grade II-III   8/8  L GHJ inf and post mob grade III-IV  Exam findings, current strength deficits, mechanics of shoulder and pain reaching OH  Exercises - Standing Shoulder Posterior Capsule Stretch  - 1 x daily - 7 x weekly - 1 sets - 3 reps - 30 hold - Standing Shoulder Extension with Dowel  - 1 x daily - 7 x weekly - 1 sets - 10 reps - 5 hold - Standing Overhead Triceps Stretch  - 1 x daily - 7 x weekly - 1 sets - 3 reps - 30 hold - Standing Shoulder Row with Blue TB Anchored Resistance  single arm 3 sets - 10 reps - Shoulder Abduction with Dumbbells - Thumbs Up  - 1 x daily - 4-5 x weekly - 2 sets - 10 reps - Shoulder External Rotation and Scapular Retraction with Resistance  - 1 x daily - 4-5 x weekly - 2 sets - 10 reps - Standing Bicep Curls Supinated with Dumbbells  - 1 x daily - 4-5 x weekly - 3 sets - 10 reps - Push Up on Table  - 1 x daily - 4-5 x weekly - 3 sets - 8 reps  7/18  UBE L1 2 min fwd and retro; 4 mins total  Bicep curl 3lbs 3x8-10 Flexion and ABD wall slide 3x8 Blue TB row 3x10 Cane ext 5s 10x Counter push up  Exercise progression, regression, precautions while traveling    7/11  UBE L1 2 min fwd and retro; 4 mins total  STM L infra, rear delt L GHJ post and inf glide grade III   Posterior shoulder stretch 30s 3x  Band OH lat stretch in door 30s 3x  OH triceps stretch 30s 3x   GTB triceps extension 3x10 GTB row in ABD 3x10 Wall push up 3x10  PATIENT EDUCATION:  Education details: Teacher, music of condition, POC, HEP, exercise form/rationale Person educated: Patient Education method: Explanation, Demonstration, Tactile cues, Verbal cues, and  Handouts Education comprehension: verbalized understanding, returned demonstration, verbal cues required, tactile cues required, and needs further education  HOME EXERCISE PROGRAM: [id number]   ASSESSMENT:  CLINICAL IMPRESSION: Pt is progressing well.  He reports continued difficulty and some pain with shoulder abduction.  Pt performed wall slides in abd and standing wand abd to improve ease with abd and ROM.  PT reviewed HEP and pt demonstrates good understanding of exercises.  He performed exercises well without any c/o's.  He responded well to Rx reporting no increased pain after Rx and stated he felt more loose.  He should benefit from cont skilled PT services per protocol to address ongoing goals and to restore desired level of function.   OBJECTIVE IMPAIRMENTS: decreased activity tolerance, decreased ROM, decreased strength, increased muscle spasms, impaired flexibility, impaired UE functional use, improper body mechanics, and pain.   ACTIVITY LIMITATIONS: carrying, lifting, dressing, reach over head, and hygiene/grooming  PARTICIPATION LIMITATIONS: meal prep, cleaning, laundry, driving, shopping, community activity, and occupation  PERSONAL FACTORS: Time since onset of injury/illness/exacerbation are also affecting patient's functional outcome.   REHAB POTENTIAL: Good  CLINICAL DECISION MAKING: Stable/uncomplicated  EVALUATION COMPLEXITY: Low   GOALS: Goals reviewed with patient? Yes  SHORT TERM GOALS: Target date: 6/22  GHJ flexion and abd to 90 against gravity with proper form Baseline: Goal status: achieved  2.  Completing HEP and ADLs pain <=3/10 Baseline:  Goal status:achieved   LONG TERM GOALS: Target date: POC date  Meet FOTO goal Baseline:  Goal status: ongoing  2.  Able to reach all overhead objects without limitation by pain Baseline:  Goal status: ongoing  3.  Gross GHJ strength 5/5 Baseline:  Goal status: ongoing  4.  Able to complete all aDLs  without limitation by pain Baseline:  Goal status: ongoing   PLAN:  PT FREQUENCY: 1x/week  PT DURATION: through POC date  PLANNED INTERVENTIONS: Therapeutic exercises, Therapeutic activity, Neuromuscular re-education, Patient/Family education, Self Care, Joint mobilization, Aquatic Therapy, Dry Needling, Electrical stimulation, Spinal mobilization, Cryotherapy, Moist heat, scar mobilization, Taping, Traction, Ionotophoresis 4mg /ml Dexamethasone, Manual therapy, and Re-evaluation.  PLAN FOR NEXT SESSION: periscap strength/stability; joint mobs PRN  Audie Clear III PT, DPT 09/07/22 12:37 PM

## 2022-09-06 ENCOUNTER — Ambulatory Visit (HOSPITAL_BASED_OUTPATIENT_CLINIC_OR_DEPARTMENT_OTHER): Payer: Self-pay | Admitting: Physical Therapy

## 2022-09-06 DIAGNOSIS — M25512 Pain in left shoulder: Secondary | ICD-10-CM

## 2022-09-06 DIAGNOSIS — M6281 Muscle weakness (generalized): Secondary | ICD-10-CM

## 2022-09-07 ENCOUNTER — Encounter (HOSPITAL_BASED_OUTPATIENT_CLINIC_OR_DEPARTMENT_OTHER): Payer: Self-pay | Admitting: Physical Therapy

## 2022-09-12 NOTE — Therapy (Signed)
OUTPATIENT PHYSICAL THERAPY TREATMENT   Patient Name: Patrick [name] MRN: [id number] DOB:December 06, 1960, 62 y.o., male Today's Date: 09/13/2022  END OF SESSION:  PT End of Session - 09/13/22 0924     Visit Number 12    Number of Visits 21    Date for PT Re-Evaluation 11/24/22    Authorization Type AETNA    PT Start Time 0852    PT Stop Time 0937    PT Time Calculation (min) 45 min    Activity Tolerance Patient tolerated treatment well    Behavior During Therapy [facility] for tasks assessed/performed                   Past Medical History:  Diagnosis Date   ADHD (attention deficit hyperactivity disorder), inattentive type    sees Dr. Evelene Croon    Anxiety    sees Dr. Evelene Croon    Arthritis    DDD (degenerative disc disease), lumbar    Depression    sees Dr. Evelene Croon    Environmental allergies    GERD (gastroesophageal reflux disease)    History of blood transfusion    Hypertension    Hypothyroidism    partial thyroidectomy for benign nodule    Past Surgical History:  Procedure Laterality Date   BACK SURGERY Right 1985   microdiscectomy   COLONOSCOPY  02/19/2020   per Dr. Dulce Sellar, precancerous polyp, repeat in 3 yrs   SHOULDER ARTHROSCOPY WITH ROTATOR CUFF REPAIR Left 06/15/2022   Procedure: LEFT SHOULDER ARTHROSCOPY WITH ROTATOR CUFF REPAIR;  Surgeon: Huel Cote, MD;  Location: [facility];  Service: Orthopedics;  Laterality: Left;   THYROIDECTOMY, PARTIAL  1998   TOTAL HIP ARTHROPLASTY Left 05/02/2012   Procedure: LEFT TOTAL HIP ARTHROPLASTY ANTERIOR APPROACH;  Surgeon: Shelda Pal, MD;  Location: [facility];  Service: Orthopedics;  Laterality: Left;   Patient Active Problem List   Diagnosis Date Noted   Calcific tendinitis 06/15/2022   Preoperative testing 12/26/2020   Chronic right hip pain 08/20/2020   BPH with urinary obstruction 10/17/2018   Gout 08/11/2016   Depression with anxiety 08/11/2016   HTN (hypertension) 08/11/2016   ADHD (attention  deficit hyperactivity disorder), inattentive type 08/11/2016   Hypothyroidism 08/11/2016   Environmental allergies 08/11/2016   GERD (gastroesophageal reflux disease) 08/11/2016   Overweight (BMI 25.0-29.9) 05/03/2012     REFERRING PROVIDER: Steward Drone, MD  REFERRING DIAG: S46.012A (ICD-10-CM) - Traumatic complete tear of left rotator cuff, initial encounter  S/p Lt biceps repair (cuff in tact per MD)  Rationale for Evaluation and Treatment: Rehabilitation  THERAPY DIAG:  Acute pain of left shoulder  Muscle weakness (generalized)  ONSET DATE: DOS 06/15/22  Days since surgery: 90    SUBJECTIVE:  SUBJECTIVE STATEMENT:  Pt is 12 weeks and 6 days post op.  Pt has pain and difficulty with reaching out laterally.  Pt states he had increased pain a few nights ago when he reached out and back trying to turn off the lamp.  He feels like his strength is doing ok.  There are certain positions he can't do, though it's not bothering him that much.  He thinks the stretching is helping.  Pt reports compliance with HEP.  Pt denies any pain after Rx, just some soreness.      PERTINENT HISTORY:  RC calcification, bil hip replacement, prior left knee injury  PAIN:  Are you having pain? No: NPRS scale:  2/10 Pain location: L shoulder  Pain description: sharp, pinch Aggravating factors: reaching  Relieving factors: getting arm back close to body   PRECAUTIONS:  None  WEIGHT BEARING RESTRICTIONS:  Yes POSTOPERATIVE PLAN: He will follow the biceps repair protocol.  He will be active range of motion overhead as tolerated.  FALLS:  Has patient fallen in last 6 months? No   OCCUPATION:  It consultant  PLOF:  Independent  PATIENT GOALS:  Move around and use arm normally   OBJECTIVE:    TREATMENT:     Therapeutic Exercise: UBE L1 2 min fwd and retro; 4 mins total Standing wand abd x 10 reps Blue TB row 3x10 GTB shld ext with retraction 2x10 Bilat ER with retraction with GTB 2x10 GTB IR 3x10 Bicep curl 3lbs 3x10  Manual Therapy: L GHJ inf and post mob grade II-III f/b L shoulder PROM in flexion, abd, and ER in supine per pt and tissue tolerance  Neuro Re-ed Activities: 4D Ball rolls on wall 2x10 ABC with ball on wall x 1 rep YTB walls walks (5 steps) x 2 reps                                                                                                                              PATIENT EDUCATION:  Education details: Anatomy of condition, POC, HEP, exercise form/rationale Person educated: Patient Education method: Explanation, Demonstration, Tactile cues, Verbal cues Education comprehension: verbalized understanding, returned demonstration, verbal cues required, tactile cues required  HOME EXERCISE PROGRAM: [id number]   ASSESSMENT:  CLINICAL IMPRESSION: He reports continued difficulty and some pain with shoulder abduction, though reports his shoulder is doing well overall.  PT performed shoulder PROM and pt tolerated PROM well.  He had good PROM in flexion, abd, and ER and had some tightness at end range flexion.  He performed ther ex and neuro re-ed activities well without c/o's.  He responded well to Rx reporting improved pain from 2/10 before Rx to 0/10 after Rx.  He should benefit from cont skilled PT services per protocol to address ongoing goals and to restore desired level of function.   OBJECTIVE IMPAIRMENTS: decreased activity tolerance, decreased ROM, decreased strength, increased muscle spasms, impaired flexibility, impaired UE functional use, improper body  mechanics, and pain.   ACTIVITY LIMITATIONS: carrying, lifting, dressing, reach over head, and hygiene/grooming  PARTICIPATION LIMITATIONS: meal prep, cleaning, laundry, driving, shopping, community  activity, and occupation  PERSONAL FACTORS: Time since onset of injury/illness/exacerbation are also affecting patient's functional outcome.   REHAB POTENTIAL: Good  CLINICAL DECISION MAKING: Stable/uncomplicated  EVALUATION COMPLEXITY: Low   GOALS: Goals reviewed with patient? Yes  SHORT TERM GOALS: Target date: 6/22  GHJ flexion and abd to 90 against gravity with proper form Baseline: Goal status: achieved  2.  Completing HEP and ADLs pain <=3/10 Baseline:  Goal status:achieved   LONG TERM GOALS: Target date: POC date  Meet FOTO goal Baseline:  Goal status: ongoing  2.  Able to reach all overhead objects without limitation by pain Baseline:  Goal status: ongoing  3.  Gross GHJ strength 5/5 Baseline:  Goal status: ongoing  4.  Able to complete all aDLs without limitation by pain Baseline:  Goal status: ongoing   PLAN:  PT FREQUENCY: 1x/week  PT DURATION: through POC date  PLANNED INTERVENTIONS: Therapeutic exercises, Therapeutic activity, Neuromuscular re-education, Patient/Family education, Self Care, Joint mobilization, Aquatic Therapy, Dry Needling, Electrical stimulation, Spinal mobilization, Cryotherapy, Moist heat, scar mobilization, Taping, Traction, Ionotophoresis 4mg /ml Dexamethasone, Manual therapy, and Re-evaluation.  PLAN FOR NEXT SESSION: periscap strength/stability; joint mobs PRN  Audie Clear III PT, DPT 09/13/22 11:55 AM

## 2022-09-13 ENCOUNTER — Ambulatory Visit (INDEPENDENT_AMBULATORY_CARE_PROVIDER_SITE_OTHER): Payer: 59 | Admitting: Orthopaedic Surgery

## 2022-09-13 ENCOUNTER — Encounter (HOSPITAL_BASED_OUTPATIENT_CLINIC_OR_DEPARTMENT_OTHER): Payer: Self-pay | Admitting: Physical Therapy

## 2022-09-13 ENCOUNTER — Ambulatory Visit (HOSPITAL_BASED_OUTPATIENT_CLINIC_OR_DEPARTMENT_OTHER): Payer: 59 | Admitting: Physical Therapy

## 2022-09-13 DIAGNOSIS — M25512 Pain in left shoulder: Secondary | ICD-10-CM

## 2022-09-13 DIAGNOSIS — M6281 Muscle weakness (generalized): Secondary | ICD-10-CM

## 2022-09-13 DIAGNOSIS — S46012A Strain of muscle(s) and tendon(s) of the rotator cuff of left shoulder, initial encounter: Secondary | ICD-10-CM

## 2022-09-13 NOTE — Progress Notes (Signed)
Chief Complaint: Left shoulder arthroscopic debridement and biceps tenodesis 5/28     History of Present Illness:   09/13/2022:   Patrick [name] is a 62 y.o. male presents status post the above procedure.  He is having some overhead pain although this is continuing to improve.    Surgical History:   none  PMH/PSH/Family History/Social History/Meds/Allergies:    Past Medical History:  Diagnosis Date   ADHD (attention deficit hyperactivity disorder), inattentive type    sees Dr. Evelene Croon    Anxiety    sees Dr. Evelene Croon    Arthritis    DDD (degenerative disc disease), lumbar    Depression    sees Dr. Evelene Croon    Environmental allergies    GERD (gastroesophageal reflux disease)    History of blood transfusion    Hypertension    Hypothyroidism    partial thyroidectomy for benign nodule    Past Surgical History:  Procedure Laterality Date   BACK SURGERY Right 1985   microdiscectomy   COLONOSCOPY  02/19/2020   per Dr. Dulce Sellar, precancerous polyp, repeat in 3 yrs   SHOULDER ARTHROSCOPY WITH ROTATOR CUFF REPAIR Left 06/15/2022   Procedure: LEFT SHOULDER ARTHROSCOPY WITH ROTATOR CUFF REPAIR;  Surgeon: Huel Cote, MD;  Location: [facility];  Service: Orthopedics;  Laterality: Left;   THYROIDECTOMY, PARTIAL  1998   TOTAL HIP ARTHROPLASTY Left 05/02/2012   Procedure: LEFT TOTAL HIP ARTHROPLASTY ANTERIOR APPROACH;  Surgeon: Shelda Pal, MD;  Location: [facility];  Service: Orthopedics;  Laterality: Left;   Social History   Socioeconomic History   Marital status: Married    Spouse name: Not on file   Number of children: Not on file   Years of education: Not on file   Highest education level: Not on file  Occupational History   Not on file  Tobacco Use   Smoking status: Former    Current packs/day: 0.00    Types: Cigarettes    Quit date: 04/27/2002    Years since quitting: 20.3   Smokeless tobacco: Former    Quit date:  04/27/2002  Substance and Sexual Activity   Alcohol use: Yes    Comment: 3-4 glasses wine/beer  week   Drug use: No   Sexual activity: Not on file  Other Topics Concern   Not on file  Social History Narrative   Not on file   Social Determinants of Health   Financial Resource Strain: Not on file  Food Insecurity: No Food Insecurity (01/06/2022)   Hunger Vital Sign    Worried About Running Out of Food in the Last Year: Never true    Ran Out of Food in the Last Year: Never true  Transportation Needs: No Transportation Needs (01/06/2022)   PRAPARE - Administrator, Civil Service (Medical): No    Lack of Transportation (Non-Medical): No  Physical Activity: Not on file  Stress: Not on file  Social Connections: Not on file   No family history on file. No Active Allergies  Current Outpatient Medications  Medication Sig Dispense Refill   allopurinol (ZYLOPRIM) 100 MG tablet TAKE 1 TABLET BY MOUTH EVERY DAY 90 tablet 0   amLODipine (NORVASC) 5 MG tablet TAKE 1 TABLET BY MOUTH TWICE DAILY 180 tablet 0   amphetamine-dextroamphetamine (ADDERALL XR) 30 MG 24  hr capsule Take 1 capsule (30 mg total) by mouth every morning. 30 capsule 0   aspirin EC 325 MG tablet Take 1 tablet (325 mg total) by mouth daily. 14 tablet 0   FLUoxetine (PROZAC) 40 MG capsule Take 40 mg by mouth daily.     levothyroxine (SYNTHROID) 50 MCG tablet TAKE 1 TABLET(50 MCG) BY MOUTH DAILY BEFORE BREAKFAST 90 tablet 0   loratadine (CLARITIN) 10 MG tablet Take 10 mg by mouth daily.     losartan (COZAAR) 100 MG tablet TAKE 1 TABLET BY MOUTH EVERY DAY 90 tablet 0   omeprazole (PRILOSEC) 20 MG capsule TAKE 1 CAPSULE BY MOUTH DAILY 90 capsule 3   propranolol ER (INDERAL LA) 80 MG 24 hr capsule TAKE 1 CAPSULE BY MOUTH DAILY BEFORE BREAKFAST 90 capsule 0   tamsulosin (FLOMAX) 0.4 MG CAPS capsule TAKE 1 CAPSULE(0.4 MG) BY MOUTH DAILY 30 capsule 3   No current facility-administered medications for this visit.   No  results found.  Review of Systems:   A ROS was performed including pertinent positives and negatives as documented in the HPI.  Physical Exam :   Constitutional: NAD and appears stated age Neurological: Alert and oriented Psych: Appropriate affect and cooperative There were no vitals taken for this visit.   Comprehensive Musculoskeletal Exam:    Left shoulder incisions are well-appearing without erythema or drainage.  He has active forward elevation to 170 degrees bilaterally.  External rotation at the side is 40 degrees.  Internal rotation is to L1 bilaterally    Imaging:     I personally reviewed and interpreted the radiographs.   Assessment:   62 y.o. male 12 weeks status post left shoulder calcific tendinitis debridement and biceps tenodesis.  Overall doing very well.  He is having some pain and tenderness which is continuing to improve slowly.  Which is continuing to improve slowly.  At this time I would like to see him back in 12 weeks for reassessment.  He will continue to work through physical therapy with strengthening and range of motion  Plan :    -Return to clinic in 12 weeks for reassessment  I personally saw and evaluated the patient, and participated in the management and treatment plan.  Huel Cote, MD Attending Physician, Orthopedic Surgery  This document was dictated using Dragon voice recognition software. A reasonable attempt at proof reading has been made to minimize errors.

## 2022-09-21 ENCOUNTER — Encounter (HOSPITAL_BASED_OUTPATIENT_CLINIC_OR_DEPARTMENT_OTHER): Payer: Self-pay | Admitting: Physical Therapy

## 2022-09-21 ENCOUNTER — Ambulatory Visit (HOSPITAL_BASED_OUTPATIENT_CLINIC_OR_DEPARTMENT_OTHER): Payer: 59 | Attending: Orthopaedic Surgery | Admitting: Physical Therapy

## 2022-09-21 DIAGNOSIS — M6281 Muscle weakness (generalized): Secondary | ICD-10-CM | POA: Insufficient documentation

## 2022-09-21 DIAGNOSIS — M25512 Pain in left shoulder: Secondary | ICD-10-CM | POA: Diagnosis present

## 2022-09-21 NOTE — Therapy (Signed)
OUTPATIENT PHYSICAL THERAPY TREATMENT   Patient Name: Patrick [name] MRN: [id number] DOB:Apr 20, 1960, 62 y.o., male Today's Date: 09/22/2022  END OF SESSION:  PT End of Session - 09/21/22 1426     Visit Number 13    Number of Visits 21    Date for PT Re-Evaluation 11/24/22    Authorization Type AETNA    PT Start Time 1410    PT Stop Time 1450    PT Time Calculation (min) 40 min    Activity Tolerance Patient tolerated treatment well    Behavior During Therapy WFL for tasks assessed/performed                    Past Medical History:  Diagnosis Date   ADHD (attention deficit hyperactivity disorder), inattentive type    sees Dr. Evelene Croon    Anxiety    sees Dr. Evelene Croon    Arthritis    DDD (degenerative disc disease), lumbar    Depression    sees Dr. Evelene Croon    Environmental allergies    GERD (gastroesophageal reflux disease)    History of blood transfusion    Hypertension    Hypothyroidism    partial thyroidectomy for benign nodule    Past Surgical History:  Procedure Laterality Date   BACK SURGERY Right 1985   microdiscectomy   COLONOSCOPY  02/19/2020   per Dr. Dulce Sellar, precancerous polyp, repeat in 3 yrs   SHOULDER ARTHROSCOPY WITH ROTATOR CUFF REPAIR Left 06/15/2022   Procedure: LEFT SHOULDER ARTHROSCOPY WITH ROTATOR CUFF REPAIR;  Surgeon: Huel Cote, MD;  Location: [facility];  Service: Orthopedics;  Laterality: Left;   THYROIDECTOMY, PARTIAL  1998   TOTAL HIP ARTHROPLASTY Left 05/02/2012   Procedure: LEFT TOTAL HIP ARTHROPLASTY ANTERIOR APPROACH;  Surgeon: Shelda Pal, MD;  Location: [facility];  Service: Orthopedics;  Laterality: Left;   Patient Active Problem List   Diagnosis Date Noted   Calcific tendinitis 06/15/2022   Preoperative testing 12/26/2020   Chronic right hip pain 08/20/2020   BPH with urinary obstruction 10/17/2018   Gout 08/11/2016   Depression with anxiety 08/11/2016   HTN (hypertension) 08/11/2016   ADHD (attention  deficit hyperactivity disorder), inattentive type 08/11/2016   Hypothyroidism 08/11/2016   Environmental allergies 08/11/2016   GERD (gastroesophageal reflux disease) 08/11/2016   Overweight (BMI 25.0-29.9) 05/03/2012     REFERRING PROVIDER: Steward Drone, MD  REFERRING DIAG: S46.012A (ICD-10-CM) - Traumatic complete tear of left rotator cuff, initial encounter  S/p Lt biceps repair (cuff in tact per MD)  Rationale for Evaluation and Treatment: Rehabilitation  THERAPY DIAG:  Acute pain of left shoulder  Muscle weakness (generalized)  ONSET DATE: DOS 06/15/22  Days since surgery: 98    SUBJECTIVE:  SUBJECTIVE STATEMENT:  Pt is 14 weeks post op.  Pt states he is progressing.  Pt states he is improving with shoulder abduction and has less pain with abduction.  Pt reports compliance with HEP.  Pt denies any adverse effects after prior Rx.     PERTINENT HISTORY:  RC calcification, bil hip replacement, prior left knee injury  PAIN:  Are you having pain? No: NPRS scale:  2/10 Pain location: L shoulder  Pain description: sharp, pinch Aggravating factors: reaching  Relieving factors: getting arm back close to body   PRECAUTIONS:  None  WEIGHT BEARING RESTRICTIONS:  Yes POSTOPERATIVE PLAN: He will follow the biceps repair protocol.  He will be active range of motion overhead as tolerated.  FALLS:  Has patient fallen in last 6 months? No   OCCUPATION:  It consultant  PLOF:  Independent  PATIENT GOALS:  Move around and use arm normally   OBJECTIVE:    TREATMENT:    Therapeutic Exercise: UBE L 2.0 - 2.5, 2 min fwd and retro; 4 mins total Standing wand abd 2 x 10 reps Blue TB row 3x10 Bilat ER with retraction with GTB 2x10 GTB IR 3x10   Manual Therapy: L GHJ inf and post mob  grade II-III f/b L shoulder PROM in flexion, abd, and ER in supine per pt and tissue tolerance  Neuro Re-ed Activities: 4D Ball rolls on wall 2x10 with 1.1# ball ABC with ball on wall x 1 rep with 1.1# ball YTB walls walks (5 steps) x 3 reps                                                                                                                             PATIENT EDUCATION:  Education details: Anatomy of condition, POC, HEP, exercise form/rationale Person educated: Patient Education method: Explanation, Demonstration, Tactile cues, Verbal cues Education comprehension: verbalized understanding, returned demonstration, verbal cues required, tactile cues required  HOME EXERCISE PROGRAM: [id number]   ASSESSMENT:  CLINICAL IMPRESSION: Pt reports improved shoulder abduction AROM.  He has some end range stiffness in flex and abd PROM though has good PROM overall.  He performed ther ex and neuro re-ed activities well without c/o's.  He responded well to Rx reporting improved tightness and flexibility after Rx and had no increased pain.  He should benefit from cont skilled PT services per protocol to address ongoing goals and to restore desired level of function.     OBJECTIVE IMPAIRMENTS: decreased activity tolerance, decreased ROM, decreased strength, increased muscle spasms, impaired flexibility, impaired UE functional use, improper body mechanics, and pain.   ACTIVITY LIMITATIONS: carrying, lifting, dressing, reach over head, and hygiene/grooming  PARTICIPATION LIMITATIONS: meal prep, cleaning, laundry, driving, shopping, community activity, and occupation  PERSONAL FACTORS: Time since onset of injury/illness/exacerbation are also affecting patient's functional outcome.   REHAB POTENTIAL: Good  CLINICAL DECISION MAKING: Stable/uncomplicated  EVALUATION COMPLEXITY: Low   GOALS: Goals reviewed with patient? Yes  SHORT TERM GOALS:  Target date: 6/22  GHJ flexion and abd to  90 against gravity with proper form Baseline: Goal status: achieved  2.  Completing HEP and ADLs pain <=3/10 Baseline:  Goal status:achieved   LONG TERM GOALS: Target date: POC date  Meet FOTO goal Baseline:  Goal status: ongoing  2.  Able to reach all overhead objects without limitation by pain Baseline:  Goal status: ongoing  3.  Gross GHJ strength 5/5 Baseline:  Goal status: ongoing  4.  Able to complete all aDLs without limitation by pain Baseline:  Goal status: ongoing   PLAN:  PT FREQUENCY: 1x/week  PT DURATION: through POC date  PLANNED INTERVENTIONS: Therapeutic exercises, Therapeutic activity, Neuromuscular re-education, Patient/Family education, Self Care, Joint mobilization, Aquatic Therapy, Dry Needling, Electrical stimulation, Spinal mobilization, Cryotherapy, Moist heat, scar mobilization, Taping, Traction, Ionotophoresis 4mg /ml Dexamethasone, Manual therapy, and Re-evaluation.  PLAN FOR NEXT SESSION: periscap strength/stability; joint mobs PRN  Audie Clear III PT, DPT 09/22/22 10:15 PM

## 2022-09-27 ENCOUNTER — Ambulatory Visit (HOSPITAL_BASED_OUTPATIENT_CLINIC_OR_DEPARTMENT_OTHER): Payer: 59 | Admitting: Physical Therapy

## 2022-09-27 ENCOUNTER — Encounter (HOSPITAL_BASED_OUTPATIENT_CLINIC_OR_DEPARTMENT_OTHER): Payer: Self-pay | Admitting: Physical Therapy

## 2022-09-27 DIAGNOSIS — M6281 Muscle weakness (generalized): Secondary | ICD-10-CM

## 2022-09-27 DIAGNOSIS — M25512 Pain in left shoulder: Secondary | ICD-10-CM | POA: Diagnosis not present

## 2022-09-27 NOTE — Therapy (Signed)
OUTPATIENT PHYSICAL THERAPY TREATMENT   Patient Name: Patrick [name] MRN: [id number] DOB:1960/10/14, 62 y.o., male Today's Date: 09/27/2022  END OF SESSION:  PT End of Session - 09/27/22 1452     Visit Number 14    Number of Visits 21    Date for PT Re-Evaluation 11/24/22    Authorization Type AETNA    PT Start Time 1405    PT Stop Time 1445    PT Time Calculation (min) 40 min    Activity Tolerance Patient tolerated treatment well    Behavior During Therapy WFL for tasks assessed/performed                     Past Medical History:  Diagnosis Date   ADHD (attention deficit hyperactivity disorder), inattentive type    sees Dr. Evelene Croon    Anxiety    sees Dr. Evelene Croon    Arthritis    DDD (degenerative disc disease), lumbar    Depression    sees Dr. Evelene Croon    Environmental allergies    GERD (gastroesophageal reflux disease)    History of blood transfusion    Hypertension    Hypothyroidism    partial thyroidectomy for benign nodule    Past Surgical History:  Procedure Laterality Date   BACK SURGERY Right 1985   microdiscectomy   COLONOSCOPY  02/19/2020   per Dr. Dulce Sellar, precancerous polyp, repeat in 3 yrs   SHOULDER ARTHROSCOPY WITH ROTATOR CUFF REPAIR Left 06/15/2022   Procedure: LEFT SHOULDER ARTHROSCOPY WITH ROTATOR CUFF REPAIR;  Surgeon: Huel Cote, MD;  Location: [facility];  Service: Orthopedics;  Laterality: Left;   THYROIDECTOMY, PARTIAL  1998   TOTAL HIP ARTHROPLASTY Left 05/02/2012   Procedure: LEFT TOTAL HIP ARTHROPLASTY ANTERIOR APPROACH;  Surgeon: Shelda Pal, MD;  Location: [facility];  Service: Orthopedics;  Laterality: Left;   Patient Active Problem List   Diagnosis Date Noted   Calcific tendinitis 06/15/2022   Preoperative testing 12/26/2020   Chronic right hip pain 08/20/2020   BPH with urinary obstruction 10/17/2018   Gout 08/11/2016   Depression with anxiety 08/11/2016   HTN (hypertension) 08/11/2016   ADHD (attention  deficit hyperactivity disorder), inattentive type 08/11/2016   Hypothyroidism 08/11/2016   Environmental allergies 08/11/2016   GERD (gastroesophageal reflux disease) 08/11/2016   Overweight (BMI 25.0-29.9) 05/03/2012     REFERRING PROVIDER: Steward Drone, MD  REFERRING DIAG: S46.012A (ICD-10-CM) - Traumatic complete tear of left rotator cuff, initial encounter  S/p Lt biceps repair (cuff in tact per MD)  Rationale for Evaluation and Treatment: Rehabilitation  THERAPY DIAG:  Acute pain of left shoulder  Muscle weakness (generalized)  ONSET DATE: DOS 06/15/22  Days since surgery: 104    SUBJECTIVE:  SUBJECTIVE STATEMENT:  Pt is nearly 15 weeks post op.  Pt states he is progressing.  Pt has pain with reaching up and behind him with a sharp pinch. Denies clicking and cracking.   PERTINENT HISTORY:  RC calcification, bil hip replacement, prior left knee injury  PAIN:  Are you having pain? No: NPRS scale:  2/10 Pain location: L shoulder  Pain description: sharp, pinch Aggravating factors: reaching  Relieving factors: getting arm back close to body   PRECAUTIONS:  None  WEIGHT BEARING RESTRICTIONS:  Yes POSTOPERATIVE PLAN: He will follow the biceps repair protocol.  He will be active range of motion overhead as tolerated.  FALLS:  Has patient fallen in last 6 months? No   OCCUPATION:  It consultant  PLOF:  Independent  PATIENT GOALS:  Move around and use arm normally   OBJECTIVE:    TREATMENT:    Therapeutic Exercise: AAROM ABD pulleys 2 min Dowel ER stretch in fwd flexion 5s 10x Wall ABD slides 20x Overhead press 2lbs 2x10 Self STM demo Standing posterior shoulder/deltoid stretch 30s 2x   Manual Therapy: L GHJ inf and anterior mob grade IV STM L deltoid                                                                                                                               PATIENT EDUCATION:  Education details: Teacher, music of condition, POC, HEP, exercise form/rationale Person educated: Patient Education method: Explanation, Demonstration, Tactile cues, Verbal cues Education comprehension: verbalized understanding, returned demonstration, verbal cues required, tactile cues required  HOME EXERCISE PROGRAM: [id number]   ASSESSMENT:  CLINICAL IMPRESSION: Pt with pain into deltoid and superior aspect of the shoulder with muscle activation going into ABD. Pt had abolishment of pain/pinch with AROM ABD following manual therapy. HEP updated today to improve anterior should capsule stiffness and OH stiffness. PROM without pain but is very stiff into L GHJ.  Plan to continue with aggressive joint mobs and progress strengthening into OH ranges as tolerated. Pt should benefit from cont skilled PT services per protocol to address ongoing goals and to restore desired level of function.     OBJECTIVE IMPAIRMENTS: decreased activity tolerance, decreased ROM, decreased strength, increased muscle spasms, impaired flexibility, impaired UE functional use, improper body mechanics, and pain.   ACTIVITY LIMITATIONS: carrying, lifting, dressing, reach over head, and hygiene/grooming  PARTICIPATION LIMITATIONS: meal prep, cleaning, laundry, driving, shopping, community activity, and occupation  PERSONAL FACTORS: Time since onset of injury/illness/exacerbation are also affecting patient's functional outcome.   REHAB POTENTIAL: Good  CLINICAL DECISION MAKING: Stable/uncomplicated  EVALUATION COMPLEXITY: Low   GOALS: Goals reviewed with patient? Yes  SHORT TERM GOALS: Target date: 6/22  GHJ flexion and abd to 90 against gravity with proper form Baseline: Goal status: achieved  2.  Completing HEP and ADLs pain <=3/10 Baseline:  Goal status:achieved   LONG TERM  GOALS: Target date: POC date  Meet FOTO goal Baseline:  Goal  status: ongoing  2.  Able to reach all overhead objects without limitation by pain Baseline:  Goal status: ongoing  3.  Gross GHJ strength 5/5 Baseline:  Goal status: ongoing  4.  Able to complete all aDLs without limitation by pain Baseline:  Goal status: ongoing   PLAN:  PT FREQUENCY: 1x/week  PT DURATION: through POC date  PLANNED INTERVENTIONS: Therapeutic exercises, Therapeutic activity, Neuromuscular re-education, Patient/Family education, Self Care, Joint mobilization, Aquatic Therapy, Dry Needling, Electrical stimulation, Spinal mobilization, Cryotherapy, Moist heat, scar mobilization, Taping, Traction, Ionotophoresis 4mg /ml Dexamethasone, Manual therapy, and Re-evaluation.  PLAN FOR NEXT SESSION: periscap strength/stability; joint mobs PRN Zebedee Iba PT, DPT 09/27/22 3:01 PM

## 2022-10-04 ENCOUNTER — Encounter (HOSPITAL_BASED_OUTPATIENT_CLINIC_OR_DEPARTMENT_OTHER): Payer: Self-pay | Admitting: Physical Therapy

## 2022-10-04 ENCOUNTER — Ambulatory Visit (HOSPITAL_BASED_OUTPATIENT_CLINIC_OR_DEPARTMENT_OTHER): Payer: 59 | Admitting: Physical Therapy

## 2022-10-04 DIAGNOSIS — M25512 Pain in left shoulder: Secondary | ICD-10-CM | POA: Diagnosis not present

## 2022-10-04 DIAGNOSIS — M6281 Muscle weakness (generalized): Secondary | ICD-10-CM

## 2022-10-04 NOTE — Therapy (Signed)
OUTPATIENT PHYSICAL THERAPY TREATMENT   Patient Name: Patrick [name] MRN: [id number] DOB:Feb 04, 1960, 62 y.o., male Today's Date: 10/04/2022  END OF SESSION:  PT End of Session - 10/04/22 0851     Visit Number 15    Number of Visits 21    Date for PT Re-Evaluation 11/24/22    Authorization Type AETNA    PT Start Time 0851    PT Stop Time 0925    PT Time Calculation (min) 34 min    Activity Tolerance Patient tolerated treatment well    Behavior During Therapy WFL for tasks assessed/performed                     Past Medical History:  Diagnosis Date   ADHD (attention deficit hyperactivity disorder), inattentive type    sees Dr. Evelene Croon    Anxiety    sees Dr. Evelene Croon    Arthritis    DDD (degenerative disc disease), lumbar    Depression    sees Dr. Evelene Croon    Environmental allergies    GERD (gastroesophageal reflux disease)    History of blood transfusion    Hypertension    Hypothyroidism    partial thyroidectomy for benign nodule    Past Surgical History:  Procedure Laterality Date   BACK SURGERY Right 1985   microdiscectomy   COLONOSCOPY  02/19/2020   per Dr. Dulce Sellar, precancerous polyp, repeat in 3 yrs   SHOULDER ARTHROSCOPY WITH ROTATOR CUFF REPAIR Left 06/15/2022   Procedure: LEFT SHOULDER ARTHROSCOPY WITH ROTATOR CUFF REPAIR;  Surgeon: Huel Cote, MD;  [facility];  Service: Orthopedics;  Laterality: Left;   THYROIDECTOMY, PARTIAL  1998   TOTAL HIP ARTHROPLASTY Left 05/02/2012   Procedure: LEFT TOTAL HIP ARTHROPLASTY ANTERIOR APPROACH;  Surgeon: Shelda Pal, MD;  Location: [facility];  Service: Orthopedics;  Laterality: Left;   Patient Active Problem List   Diagnosis Date Noted   Calcific tendinitis 06/15/2022   Preoperative testing 12/26/2020   Chronic right hip pain 08/20/2020   BPH with urinary obstruction 10/17/2018   Gout 08/11/2016   Depression with anxiety 08/11/2016   HTN (hypertension) 08/11/2016   ADHD (attention  deficit hyperactivity disorder), inattentive type 08/11/2016   Hypothyroidism 08/11/2016   Environmental allergies 08/11/2016   GERD (gastroesophageal reflux disease) 08/11/2016   Overweight (BMI 25.0-29.9) 05/03/2012     REFERRING PROVIDER: Steward Drone, MD  REFERRING DIAG: S46.012A (ICD-10-CM) - Traumatic complete tear of left rotator cuff, initial encounter  S/p Lt biceps repair (cuff in tact per MD)  Rationale for Evaluation and Treatment: Rehabilitation  THERAPY DIAG:  Acute pain of left shoulder  Muscle weakness (generalized)  ONSET DATE: DOS 06/15/22  Days since surgery: 111   SUBJECTIVE:  SUBJECTIVE STATEMENT:  ER + abd can be pinchy.   PERTINENT HISTORY:  RC calcification, bil hip replacement, prior left knee injury  PAIN:  Are you having pain? No: NPRS scale:  2/10 Pain location: L shoulder  Pain description: sharp, pinch Aggravating factors: reaching  Relieving factors: getting arm back close to body   PRECAUTIONS:  None  WEIGHT BEARING RESTRICTIONS:  Yes POSTOPERATIVE PLAN: He will follow the biceps repair protocol.  He will be active range of motion overhead as tolerated.  FALLS:  Has patient fallen in last 6 months? No   OCCUPATION:  It consultant  PLOF:  Independent  PATIENT GOALS:  Move around and use arm normally   OBJECTIVE:    TREATMENT:    Treatment                            9/16:  Trigger Point Dry Needling, Manual Therapy Treatment:  Initial or subsequent education regarding Trigger Point Dry Needling: Initial - education time included in Manual Did patient give consent to treatment with Trigger Point Dry Needling: Yes TPDN with skilled palpation and monitoring followed by STM to the following muscles: Rt upper trap & levator Prone spinal & rib  mobs; scapular mobilization  Child pose stretch- UE wider to decrease pinching Open book Supine AP mobs at 90-100 abd Passive ER/IR stretching- ER to 90, IR to 75 today Supine D2 flexion                                                                                                                              PATIENT EDUCATION:  Education details: Anatomy of condition, POC, HEP, exercise form/rationale Person educated: Patient Education method: Explanation, Demonstration, Tactile cues, Verbal cues Education comprehension: verbalized understanding, returned demonstration, verbal cues required, tactile cues required  HOME EXERCISE PROGRAM: [id number]   ASSESSMENT:  CLINICAL IMPRESSION: Decreased impingement with incr rib mobility and scapular control.      OBJECTIVE IMPAIRMENTS: decreased activity tolerance, decreased ROM, decreased strength, increased muscle spasms, impaired flexibility, impaired UE functional use, improper body mechanics, and pain.   ACTIVITY LIMITATIONS: carrying, lifting, dressing, reach over head, and hygiene/grooming  PARTICIPATION LIMITATIONS: meal prep, cleaning, laundry, driving, shopping, community activity, and occupation  PERSONAL FACTORS: Time since onset of injury/illness/exacerbation are also affecting patient's functional outcome.   REHAB POTENTIAL: Good  CLINICAL DECISION MAKING: Stable/uncomplicated  EVALUATION COMPLEXITY: Low   GOALS: Goals reviewed with patient? Yes  SHORT TERM GOALS: Target date: 6/22  GHJ flexion and abd to 90 against gravity with proper form Baseline: Goal status: achieved  2.  Completing HEP and ADLs pain <=3/10 Baseline:  Goal status:achieved   LONG TERM GOALS: Target date: POC date  Meet FOTO goal Baseline:  Goal status: ongoing  2.  Able to reach all overhead objects without limitation by pain Baseline:  Goal status: ongoing  3.  Gross GHJ strength 5/5 Baseline:  Goal status: ongoing  4.   Able to complete all aDLs without limitation by pain Baseline:  Goal status: ongoing   PLAN:  PT FREQUENCY: 1x/week  PT DURATION: through POC date  PLANNED INTERVENTIONS: Therapeutic exercises, Therapeutic activity, Neuromuscular re-education, Patient/Family education, Self Care, Joint mobilization, Aquatic Therapy, Dry Needling, Electrical stimulation, Spinal mobilization, Cryotherapy, Moist heat, scar mobilization, Taping, Traction, Ionotophoresis 4mg /ml Dexamethasone, Manual therapy, and Re-evaluation.  PLAN FOR NEXT SESSION: periscap strength/stability; joint mobs PRN Kabir Brannock C. Mylon Mabey PT, DPT 10/04/22 9:26 AM

## 2022-10-14 ENCOUNTER — Encounter (HOSPITAL_BASED_OUTPATIENT_CLINIC_OR_DEPARTMENT_OTHER): Payer: Self-pay | Admitting: Physical Therapy

## 2022-10-14 ENCOUNTER — Ambulatory Visit (HOSPITAL_BASED_OUTPATIENT_CLINIC_OR_DEPARTMENT_OTHER): Payer: 59 | Admitting: Physical Therapy

## 2022-10-14 DIAGNOSIS — M25512 Pain in left shoulder: Secondary | ICD-10-CM

## 2022-10-14 DIAGNOSIS — M6281 Muscle weakness (generalized): Secondary | ICD-10-CM

## 2022-10-14 NOTE — Therapy (Signed)
OUTPATIENT PHYSICAL THERAPY TREATMENT   Patient Name: Patrick [name] MRN: [id number] DOB:[birth year], 62 y.o., male Today's Date: 10/14/2022  END OF SESSION:  PT End of Session - 10/14/22 0802     Visit Number 16    Number of Visits 21    Date for PT Re-Evaluation 11/24/22    Authorization Type AETNA    PT Start Time 0802    PT Stop Time 0844    PT Time Calculation (min) 42 min    Activity Tolerance Patient tolerated treatment well    Behavior During Therapy WFL for tasks assessed/performed                     Past Medical History:  Diagnosis Date   ADHD (attention deficit hyperactivity disorder), inattentive type    sees Dr. Evelene Croon    Anxiety    sees Dr. Evelene Croon    Arthritis    DDD (degenerative disc disease), lumbar    Depression    sees Dr. Evelene Croon    Environmental allergies    GERD (gastroesophageal reflux disease)    History of blood transfusion    Hypertension    Hypothyroidism    partial thyroidectomy for benign nodule    Past Surgical History:  Procedure Laterality Date   BACK SURGERY Right 1985   microdiscectomy   COLONOSCOPY  02/19/2020   per Dr. Dulce Sellar, precancerous polyp, repeat in 3 yrs   SHOULDER ARTHROSCOPY WITH ROTATOR CUFF REPAIR Left 06/15/2022   Procedure: LEFT SHOULDER ARTHROSCOPY WITH ROTATOR CUFF REPAIR;  Surgeon: Huel Cote, MD;  Location: [facility];  Service: Orthopedics;  Laterality: Left;   THYROIDECTOMY, PARTIAL  1998   TOTAL HIP ARTHROPLASTY Left 05/02/2012   Procedure: LEFT TOTAL HIP ARTHROPLASTY ANTERIOR APPROACH;  Surgeon: Shelda Pal, MD;  Location: [facility];  Service: Orthopedics;  Laterality: Left;   Patient Active Problem List   Diagnosis Date Noted   Calcific tendinitis 06/15/2022   Preoperative testing 12/26/2020   Chronic right hip pain 08/20/2020   BPH with urinary obstruction 10/17/2018   Gout 08/11/2016   Depression with anxiety 08/11/2016   HTN (hypertension) 08/11/2016   ADHD (attention  deficit hyperactivity disorder), inattentive type 08/11/2016   Hypothyroidism 08/11/2016   Environmental allergies 08/11/2016   GERD (gastroesophageal reflux disease) 08/11/2016   Overweight (BMI 25.0-29.9) 05/03/2012     REFERRING PROVIDER: Steward Drone, MD  REFERRING DIAG: S46.012A (ICD-10-CM) - Traumatic complete tear of left rotator cuff, initial encounter  S/p Lt biceps repair (cuff in tact per MD)  Rationale for Evaluation and Treatment: Rehabilitation  THERAPY DIAG:  Acute pain of left shoulder  Muscle weakness (generalized)  ONSET DATE: DOS 06/15/22  Days since surgery: 121   SUBJECTIVE:  SUBJECTIVE STATEMENT:  Patient states shoulder is not feeling too bad. Some stiffness, mobility feels like its lacking.    PERTINENT HISTORY:  RC calcification, bil hip replacement, prior left knee injury  PAIN:  Are you having pain? No: NPRS scale:  0/10 Pain location: L shoulder  Pain description: sharp, pinch Aggravating factors: reaching  Relieving factors: getting arm back close to body   PRECAUTIONS:  None  WEIGHT BEARING RESTRICTIONS:  Yes POSTOPERATIVE PLAN: He will follow the biceps repair protocol.  He will be active range of motion overhead as tolerated.  FALLS:  Has patient fallen in last 6 months? No   OCCUPATION:  It consultant  PLOF:  Independent  PATIENT GOALS:  Move around and use arm normally   OBJECTIVE:    TREATMENT:   10/14/22 UBE 2 minutes forward, 2 retro level 4 Manual: Grade II- III inferior and AP glides in abduction at 80-100, AP glides into progressive ER Standing bilateral ER BTB 2 x 10 Standing horizontal abduction BTB 2 x 10  Supine shoulder PNF D2 pattern BTB 2 x 10  Standing shoulder flexion with perpendicular resistance GTB 2 x  10     Treatment                            9/16:  Trigger Point Dry Needling, Manual Therapy Treatment:  Initial or subsequent education regarding Trigger Point Dry Needling: Initial - education time included in Manual Did patient give consent to treatment with Trigger Point Dry Needling: Yes TPDN with skilled palpation and monitoring followed by STM to the following muscles: Rt upper trap & levator Prone spinal & rib mobs; scapular mobilization  Child pose stretch- UE wider to decrease pinching Open book Supine AP mobs at 90-100 abd Passive ER/IR stretching- ER to 90, IR to 75 today Supine D2 flexion                                                                                                                              PATIENT EDUCATION:  Education details: Anatomy of condition, POC, HEP, exercise form/rationale Person educated: Patient Education method: Explanation, Demonstration, Tactile cues, Verbal cues Education comprehension: verbalized understanding, returned demonstration, verbal cues required, tactile cues required  HOME EXERCISE PROGRAM: [id number]   ASSESSMENT:  CLINICAL IMPRESSION: Began session with UBE for dynamic warm up. Mild to moderate shoulder hike with fatigue. Tried to progress to standing PNF pattern but patient unable to complete without compensation, improved mechanics in supine. Fatigue 7/10 at end of session. Reassess next session. Patient will continue to benefit from physical therapy in order to improve function and reduce impairment.      OBJECTIVE IMPAIRMENTS: decreased activity tolerance, decreased ROM, decreased strength, increased muscle spasms, impaired flexibility, impaired UE functional use, improper body mechanics, and pain.   ACTIVITY LIMITATIONS: carrying, lifting, dressing, reach over head, and hygiene/grooming  PARTICIPATION LIMITATIONS: meal prep, cleaning, laundry,  driving, shopping, community activity, and  occupation  PERSONAL FACTORS: Time since onset of injury/illness/exacerbation are also affecting patient's functional outcome.   REHAB POTENTIAL: Good  CLINICAL DECISION MAKING: Stable/uncomplicated  EVALUATION COMPLEXITY: Low   GOALS: Goals reviewed with patient? Yes  SHORT TERM GOALS: Target date: 6/22  GHJ flexion and abd to 90 against gravity with proper form Baseline: Goal status: achieved  2.  Completing HEP and ADLs pain <=3/10 Baseline:  Goal status:achieved   LONG TERM GOALS: Target date: POC date  Meet FOTO goal Baseline:  Goal status: ongoing  2.  Able to reach all overhead objects without limitation by pain Baseline:  Goal status: ongoing  3.  Gross GHJ strength 5/5 Baseline:  Goal status: ongoing  4.  Able to complete all aDLs without limitation by pain Baseline:  Goal status: ongoing   PLAN:  PT FREQUENCY: 1x/week  PT DURATION: through POC date  PLANNED INTERVENTIONS: Therapeutic exercises, Therapeutic activity, Neuromuscular re-education, Patient/Family education, Self Care, Joint mobilization, Aquatic Therapy, Dry Needling, Electrical stimulation, Spinal mobilization, Cryotherapy, Moist heat, scar mobilization, Taping, Traction, Ionotophoresis 4mg /ml Dexamethasone, Manual therapy, and Re-evaluation.  PLAN FOR NEXT SESSION: periscap strength/stability; joint mobs PRN   Wyman Songster, PT 10/14/2022, 8:47 AM

## 2022-10-21 ENCOUNTER — Encounter (HOSPITAL_BASED_OUTPATIENT_CLINIC_OR_DEPARTMENT_OTHER): Payer: Self-pay | Admitting: Physical Therapy

## 2022-10-21 ENCOUNTER — Ambulatory Visit (HOSPITAL_BASED_OUTPATIENT_CLINIC_OR_DEPARTMENT_OTHER): Payer: 59 | Attending: Orthopaedic Surgery | Admitting: Physical Therapy

## 2022-10-21 DIAGNOSIS — M6281 Muscle weakness (generalized): Secondary | ICD-10-CM | POA: Diagnosis present

## 2022-10-21 DIAGNOSIS — M25512 Pain in left shoulder: Secondary | ICD-10-CM | POA: Insufficient documentation

## 2022-10-21 NOTE — Therapy (Signed)
OUTPATIENT PHYSICAL THERAPY TREATMENT   Patient Name: Patrick [name] MRN: [id number] DOB:Sep 29, 1960, 62 y.o., male Today's Date: 10/21/2022  Progress Note   Reporting Period 08/26/22 to 10/21/22   See note below for Objective Data and Assessment of Progress/Goals   END OF SESSION:  PT End of Session - 10/21/22 0806     Visit Number 17    Number of Visits 21    Date for PT Re-Evaluation 11/24/22    Authorization Type AETNA    PT Start Time 0806    PT Stop Time 0845    PT Time Calculation (min) 39 min    Activity Tolerance Patient tolerated treatment well    Behavior During Therapy WFL for tasks assessed/performed                     Past Medical History:  Diagnosis Date   ADHD (attention deficit hyperactivity disorder), inattentive type    sees Dr. Evelene Croon    Anxiety    sees Dr. Evelene Croon    Arthritis    DDD (degenerative disc disease), lumbar    Depression    sees Dr. Evelene Croon    Environmental allergies    GERD (gastroesophageal reflux disease)    History of blood transfusion    Hypertension    Hypothyroidism    partial thyroidectomy for benign nodule    Past Surgical History:  Procedure Laterality Date   BACK SURGERY Right 1985   microdiscectomy   COLONOSCOPY  02/19/2020   per Dr. Dulce Sellar, precancerous polyp, repeat in 3 yrs   SHOULDER ARTHROSCOPY WITH ROTATOR CUFF REPAIR Left 06/15/2022   Procedure: LEFT SHOULDER ARTHROSCOPY WITH ROTATOR CUFF REPAIR;  Surgeon: Huel Cote, MD;  Location: [facility];  Service: Orthopedics;  Laterality: Left;   THYROIDECTOMY, PARTIAL  1998   TOTAL HIP ARTHROPLASTY Left 05/02/2012   Procedure: LEFT TOTAL HIP ARTHROPLASTY ANTERIOR APPROACH;  Surgeon: Shelda Pal, MD;  Location: [facility];  Service: Orthopedics;  Laterality: Left;   Patient Active Problem List   Diagnosis Date Noted   Calcific tendinitis 06/15/2022   Preoperative testing 12/26/2020   Chronic right hip pain 08/20/2020   BPH with urinary  obstruction 10/17/2018   Gout 08/11/2016   Depression with anxiety 08/11/2016   HTN (hypertension) 08/11/2016   ADHD (attention deficit hyperactivity disorder), inattentive type 08/11/2016   Hypothyroidism 08/11/2016   Environmental allergies 08/11/2016   GERD (gastroesophageal reflux disease) 08/11/2016   Overweight (BMI 25.0-29.9) 05/03/2012     REFERRING PROVIDER: Steward Drone, MD  REFERRING DIAG: S46.012A (ICD-10-CM) - Traumatic complete tear of left rotator cuff, initial encounter  S/p Lt biceps repair (cuff in tact per MD)  Rationale for Evaluation and Treatment: Rehabilitation  THERAPY DIAG:  Acute pain of left shoulder  Muscle weakness (generalized)  ONSET DATE: DOS 06/15/22  Days since surgery: 128   SUBJECTIVE:  SUBJECTIVE STATEMENT:  Patient states 90% improvement with PT intervention, remains limited by flexibility and symptoms with certain movements. Reaching back can be painful. HEP going well. Feels like he is almost ready to be done with PT.    PERTINENT HISTORY:  RC calcification, bil hip replacement, prior left knee injury  PAIN:  Are you having pain? No: NPRS scale:  0/10 Pain location: L shoulder  Pain description: sharp, pinch Aggravating factors: reaching  Relieving factors: getting arm back close to body   PRECAUTIONS:  None  WEIGHT BEARING RESTRICTIONS:  Yes POSTOPERATIVE PLAN: He will follow the biceps repair protocol.  He will be active range of motion overhead as tolerated.  FALLS:  Has patient fallen in last 6 months? No   OCCUPATION:  It consultant  PLOF:  Independent  PATIENT GOALS:  Move around and use arm normally   OBJECTIVE:  PATIENT SURVEYS:  FOTO 41   FOTO 8/8 FOTO 10/21/22: 81% function       COGNITIVE STATUS: Within functional  limits for tasks assessed        RED FLAGS: None                 SENSATION: WFL   POSTURE:  EVAL: mild forward head   HAND DOMINANCE:  Right     AROM Right 8/8  Left 8/8 Right 10/21/22 Left 10/21/22  Shoulder flexion 160 155 p! 160 155  Shoulder extension 45 45    Shoulder abduction 155 145 p! At 90 155 145  Shoulder adduction        Shoulder internal rotation 70 60    Shoulder external rotation 80 70 80 75  Elbow flexion        Elbow extension        Wrist flexion        Wrist extension        Wrist ulnar deviation        Wrist radial deviation        Wrist pronation        Wrist supination        (Blank rows = not tested)        MMT   HHD in lbs  Right 8/8  Left 8/8 Right 10/21/22 Left 10/21/22  Shoulder flexion 45.8 21.5 52 31.8 *  Shoulder extension 53.0 48.2    Shoulder abduction 45.5 12.6 51.7 31, 22.3 *  Shoulder adduction        Shoulder internal rotation 35.9 31.6 46.7 36  Shoulder external rotation 26.0 15.2 31.9 25.7  Elbow flexion        Elbow extension        Wrist flexion        Wrist extension        Wrist ulnar deviation        Wrist radial deviation        Wrist pronation        Wrist supination        (Blank rows = not tested)   TREATMENT:   10/20/22 UBE 2 minutes forward, 2 retro level 4 Reassessment Standing bilateral ER BTB 2 x 10 Standing horizontal abduction BTB 2 x 10  Standing shoulder PNF D2 pattern BTB 2 x 10  Standing shoulder abduction with perpendicular resistance GTB 2 x 10  Standing shoulder flexion with perpendicular resistance GTB 2 x 10    10/14/22 UBE 2 minutes forward, 2 retro level 4 Manual: Grade II- III inferior and AP glides in  abduction at 80-100, AP glides into progressive ER Standing bilateral ER BTB 2 x 10 Standing horizontal abduction BTB 2 x 10  Supine shoulder PNF D2 pattern BTB 2 x 10  Standing shoulder flexion with perpendicular resistance GTB 2 x 10     Treatment                             9/16:  Trigger Point Dry Needling, Manual Therapy Treatment:  Initial or subsequent education regarding Trigger Point Dry Needling: Initial - education time included in Manual Did patient give consent to treatment with Trigger Point Dry Needling: Yes TPDN with skilled palpation and monitoring followed by STM to the following muscles: Rt upper trap & levator Prone spinal & rib mobs; scapular mobilization  Child pose stretch- UE wider to decrease pinching Open book Supine AP mobs at 90-100 abd Passive ER/IR stretching- ER to 90, IR to 75 today Supine D2 flexion                                                                                                                              PATIENT EDUCATION:  Education details: Anatomy of condition, POC, HEP, exercise form/rationale Person educated: Patient Education method: Explanation, Demonstration, Tactile cues, Verbal cues Education comprehension: verbalized understanding, returned demonstration, verbal cues required, tactile cues required  HOME EXERCISE PROGRAM: [id number]   ASSESSMENT:  CLINICAL IMPRESSION: Began session with UBE for dynamic warm up. Patient has met 2/2 short term goals and 3/4 long term goals with ability to complete HEP and improvement in symptoms, strength, ROM, activity tolerance,  and functional mobility. Remaining goals not met due to continued deficits in strength. Patient has made good progress toward remaining goals. Will continue to work on strength for the next month and progress as able. Continued with shoulder and periscap strengthening. Fatigue at 7/10 in shoulder at end of session. Patient will continue to benefit from skilled physical therapy in order to improve function and reduce impairment.       OBJECTIVE IMPAIRMENTS: decreased activity tolerance, decreased ROM, decreased strength, increased muscle spasms, impaired flexibility, impaired UE functional use, improper body mechanics, and pain.    ACTIVITY LIMITATIONS: carrying, lifting, dressing, reach over head, and hygiene/grooming  PARTICIPATION LIMITATIONS: meal prep, cleaning, laundry, driving, shopping, community activity, and occupation  PERSONAL FACTORS: Time since onset of injury/illness/exacerbation are also affecting patient's functional outcome.   REHAB POTENTIAL: Good  CLINICAL DECISION MAKING: Stable/uncomplicated  EVALUATION COMPLEXITY: Low   GOALS: Goals reviewed with patient? Yes  SHORT TERM GOALS: Target date: 6/22  GHJ flexion and abd to 90 against gravity with proper form Baseline: Goal status: achieved  2.  Completing HEP and ADLs pain <=3/10 Baseline:  Goal status:achieved   LONG TERM GOALS: Target date: POC date  Meet FOTO goal Baseline:  Goal status: MET  2.  Able to reach all overhead objects without limitation by pain Baseline:  Goal status: MET  3.  Gross GHJ strength 5/5 Baseline:  Goal status: ongoing  4.  Able to complete all aDLs without limitation by pain Baseline:  Goal status: MET   PLAN:  PT FREQUENCY: 1x/week  PT DURATION: through POC date  PLANNED INTERVENTIONS: Therapeutic exercises, Therapeutic activity, Neuromuscular re-education, Patient/Family education, Self Care, Joint mobilization, Aquatic Therapy, Dry Needling, Electrical stimulation, Spinal mobilization, Cryotherapy, Moist heat, scar mobilization, Taping, Traction, Ionotophoresis 4mg /ml Dexamethasone, Manual therapy, and Re-evaluation.  PLAN FOR NEXT SESSION: Shoulder, RC, periscap strength and progress as able   Wyman Songster, PT 10/21/2022, 8:06 AM

## 2022-10-29 ENCOUNTER — Ambulatory Visit (HOSPITAL_BASED_OUTPATIENT_CLINIC_OR_DEPARTMENT_OTHER): Payer: 59 | Admitting: Physical Therapy

## 2022-10-29 ENCOUNTER — Encounter (HOSPITAL_BASED_OUTPATIENT_CLINIC_OR_DEPARTMENT_OTHER): Payer: Self-pay | Admitting: Physical Therapy

## 2022-10-29 DIAGNOSIS — M6281 Muscle weakness (generalized): Secondary | ICD-10-CM

## 2022-10-29 DIAGNOSIS — M25512 Pain in left shoulder: Secondary | ICD-10-CM | POA: Diagnosis not present

## 2022-10-29 NOTE — Therapy (Signed)
OUTPATIENT PHYSICAL THERAPY TREATMENT   Patient Name: Patrick [name] MRN: [id number] DOB:06-Mar-1960, 62 y.o., male Today's Date: 10/29/2022  Progress Note   Reporting Period 08/26/22 to 10/21/22   See note below for Objective Data and Assessment of Progress/Goals   END OF SESSION:  PT End of Session - 10/29/22 0804     Visit Number 18    Number of Visits 21    Date for PT Re-Evaluation 11/24/22    Authorization Type AETNA    PT Start Time 0803    PT Stop Time 0842    PT Time Calculation (min) 39 min    Activity Tolerance Patient tolerated treatment well    Behavior During Therapy WFL for tasks assessed/performed                     Past Medical History:  Diagnosis Date   ADHD (attention deficit hyperactivity disorder), inattentive type    sees Dr. Evelene Croon    Anxiety    sees Dr. Evelene Croon    Arthritis    DDD (degenerative disc disease), lumbar    Depression    sees Dr. Evelene Croon    Environmental allergies    GERD (gastroesophageal reflux disease)    History of blood transfusion    Hypertension    Hypothyroidism    partial thyroidectomy for benign nodule    Past Surgical History:  Procedure Laterality Date   BACK SURGERY Right 1985   microdiscectomy   COLONOSCOPY  02/19/2020   per Dr. Dulce Sellar, precancerous polyp, repeat in 3 yrs   SHOULDER ARTHROSCOPY WITH ROTATOR CUFF REPAIR Left 06/15/2022   Procedure: LEFT SHOULDER ARTHROSCOPY WITH ROTATOR CUFF REPAIR;  Surgeon: Huel Cote, MD;  Location: [facility];  Service: Orthopedics;  Laterality: Left;   THYROIDECTOMY, PARTIAL  1998   TOTAL HIP ARTHROPLASTY Left 05/02/2012   Procedure: LEFT TOTAL HIP ARTHROPLASTY ANTERIOR APPROACH;  Surgeon: Shelda Pal, MD;  Location: [facility];  Service: Orthopedics;  Laterality: Left;   Patient Active Problem List   Diagnosis Date Noted   Calcific tendinitis 06/15/2022   Preoperative testing 12/26/2020   Chronic right hip pain 08/20/2020   BPH with urinary  obstruction 10/17/2018   Gout 08/11/2016   Depression with anxiety 08/11/2016   HTN (hypertension) 08/11/2016   ADHD (attention deficit hyperactivity disorder), inattentive type 08/11/2016   Hypothyroidism 08/11/2016   Environmental allergies 08/11/2016   GERD (gastroesophageal reflux disease) 08/11/2016   Overweight (BMI 25.0-29.9) 05/03/2012     REFERRING PROVIDER: Steward Drone, MD  REFERRING DIAG: S46.012A (ICD-10-CM) - Traumatic complete tear of left rotator cuff, initial encounter  S/p Lt biceps repair (cuff in tact per MD)  Rationale for Evaluation and Treatment: Rehabilitation  THERAPY DIAG:  Acute pain of left shoulder  Muscle weakness (generalized)  ONSET DATE: DOS 06/15/22  Days since surgery: 136   SUBJECTIVE:  SUBJECTIVE STATEMENT:  Patient states muscle soreness  today. Band exercises still challenging.   PERTINENT HISTORY:  RC calcification, bil hip replacement, prior left knee injury  PAIN:  Are you having pain? No: NPRS scale:  0/10 Pain location: L shoulder  Pain description: sharp, pinch Aggravating factors: reaching  Relieving factors: getting arm back close to body   PRECAUTIONS:  None  WEIGHT BEARING RESTRICTIONS:  Yes POSTOPERATIVE PLAN: He will follow the biceps repair protocol.  He will be active range of motion overhead as tolerated.  FALLS:  Has patient fallen in last 6 months? No   OCCUPATION:  It consultant  PLOF:  Independent  PATIENT GOALS:  Move around and use arm normally   OBJECTIVE:  PATIENT SURVEYS:  FOTO 41   FOTO 8/8 FOTO 10/21/22: 81% function       COGNITIVE STATUS: Within functional limits for tasks assessed        RED FLAGS: None                 SENSATION: WFL   POSTURE:  EVAL: mild forward head   HAND DOMINANCE:   Right     AROM Right 8/8  Left 8/8 Right 10/21/22 Left 10/21/22  Shoulder flexion 160 155 p! 160 155  Shoulder extension 45 45    Shoulder abduction 155 145 p! At 90 155 145  Shoulder adduction        Shoulder internal rotation 70 60    Shoulder external rotation 80 70 80 75  Elbow flexion        Elbow extension        Wrist flexion        Wrist extension        Wrist ulnar deviation        Wrist radial deviation        Wrist pronation        Wrist supination        (Blank rows = not tested)        MMT   HHD in lbs  Right 8/8  Left 8/8 Right 10/21/22 Left 10/21/22  Shoulder flexion 45.8 21.5 52 31.8 *  Shoulder extension 53.0 48.2    Shoulder abduction 45.5 12.6 51.7 31, 22.3 *  Shoulder adduction        Shoulder internal rotation 35.9 31.6 46.7 36  Shoulder external rotation 26.0 15.2 31.9 25.7  Elbow flexion        Elbow extension        Wrist flexion        Wrist extension        Wrist ulnar deviation        Wrist radial deviation        Wrist pronation        Wrist supination        (Blank rows = not tested)   TREATMENT:   10/29/22 UBE 2 minutes forward, 2 retro level 3.5 Standing shoulder abduction with perpendicular resistance GTB 2 x 10  Standing shoulder flexion with perpendicular resistance GTB 2 x 10  Standing shoulder ER at 80 degrees of abduction GTB 2 x 10 Standing shoulder IR at 80 degrees of abduction GTB 2 x 10 Seated cable row 40# 3 x 10 Standing cable tricep 20# 3 x 10 Standing cable bicep curls 15# 3 x 10 Standing cable ER in neutral 5# 3 x 8 Standing cable IR in neutral 5# 3 x 8 Prone shoulder extension 3# 3 x 10 Prone  shoulder horizontal abduction 2# 3 x 10 Prone shoulder flexion 3 x 10   10/20/22 UBE 2 minutes forward, 2 retro level 4 Reassessment Standing bilateral ER BTB 2 x 10 Standing horizontal abduction BTB 2 x 10  Standing shoulder PNF D2 pattern BTB 2 x 10  Standing shoulder abduction with perpendicular resistance GTB 2  x 10  Standing shoulder flexion with perpendicular resistance GTB 2 x 10    10/14/22 UBE 2 minutes forward, 2 retro level 4 Manual: Grade II- III inferior and AP glides in abduction at 80-100, AP glides into progressive ER Standing bilateral ER BTB 2 x 10 Standing horizontal abduction BTB 2 x 10  Supine shoulder PNF D2 pattern BTB 2 x 10  Standing shoulder flexion with perpendicular resistance GTB 2 x 10     Treatment                            9/16:  Trigger Point Dry Needling, Manual Therapy Treatment:  Initial or subsequent education regarding Trigger Point Dry Needling: Initial - education time included in Manual Did patient give consent to treatment with Trigger Point Dry Needling: Yes TPDN with skilled palpation and monitoring followed by STM to the following muscles: Rt upper trap & levator Prone spinal & rib mobs; scapular mobilization  Child pose stretch- UE wider to decrease pinching Open book Supine AP mobs at 90-100 abd Passive ER/IR stretching- ER to 90, IR to 75 today Supine D2 flexion                                                                                                                              PATIENT EDUCATION:  Education details: Anatomy of condition, POC, HEP, exercise form/rationale Person educated: Patient Education method: Explanation, Demonstration, Tactile cues, Verbal cues Education comprehension: verbalized understanding, returned demonstration, verbal cues required, tactile cues required  HOME EXERCISE PROGRAM: [id number]   ASSESSMENT:  CLINICAL IMPRESSION: Began session with UBE for dynamic warm up. Continued with shoulder and periscapular strengthening which is performed with good mechanics. C/o pinching in shoulder with ER/IR at 90 degrees and shoulder hike present, improves at 70-80 degrees abduction. Began additional strengthening with cable machines which is tolerated well. Moderate to high fatigue at end of session. Patient will  continue to benefit from skilled physical therapy in order to improve function and reduce impairment.       OBJECTIVE IMPAIRMENTS: decreased activity tolerance, decreased ROM, decreased strength, increased muscle spasms, impaired flexibility, impaired UE functional use, improper body mechanics, and pain.   ACTIVITY LIMITATIONS: carrying, lifting, dressing, reach over head, and hygiene/grooming  PARTICIPATION LIMITATIONS: meal prep, cleaning, laundry, driving, shopping, community activity, and occupation  PERSONAL FACTORS: Time since onset of injury/illness/exacerbation are also affecting patient's functional outcome.   REHAB POTENTIAL: Good  CLINICAL DECISION MAKING: Stable/uncomplicated  EVALUATION COMPLEXITY: Low   GOALS: Goals reviewed with patient? Yes  SHORT TERM GOALS:  Target date: 6/22  GHJ flexion and abd to 90 against gravity with proper form Baseline: Goal status: achieved  2.  Completing HEP and ADLs pain <=3/10 Baseline:  Goal status:achieved   LONG TERM GOALS: Target date: POC date  Meet FOTO goal Baseline:  Goal status: MET  2.  Able to reach all overhead objects without limitation by pain Baseline:  Goal status: MET  3.  Gross GHJ strength 5/5 Baseline:  Goal status: ongoing  4.  Able to complete all aDLs without limitation by pain Baseline:  Goal status: MET   PLAN:  PT FREQUENCY: 1x/week  PT DURATION: through POC date  PLANNED INTERVENTIONS: Therapeutic exercises, Therapeutic activity, Neuromuscular re-education, Patient/Family education, Self Care, Joint mobilization, Aquatic Therapy, Dry Needling, Electrical stimulation, Spinal mobilization, Cryotherapy, Moist heat, scar mobilization, Taping, Traction, Ionotophoresis 4mg /ml Dexamethasone, Manual therapy, and Re-evaluation.  PLAN FOR NEXT SESSION: Shoulder, RC, periscap strength and progress as able   Wyman Songster, PT 10/29/2022, 8:05 AM

## 2022-11-04 ENCOUNTER — Encounter (HOSPITAL_BASED_OUTPATIENT_CLINIC_OR_DEPARTMENT_OTHER): Payer: Self-pay | Admitting: Physical Therapy

## 2022-11-10 ENCOUNTER — Encounter (HOSPITAL_BASED_OUTPATIENT_CLINIC_OR_DEPARTMENT_OTHER): Payer: Self-pay | Admitting: Physical Therapy

## 2022-11-10 ENCOUNTER — Ambulatory Visit (HOSPITAL_BASED_OUTPATIENT_CLINIC_OR_DEPARTMENT_OTHER): Payer: 59 | Admitting: Physical Therapy

## 2022-11-10 DIAGNOSIS — M25512 Pain in left shoulder: Secondary | ICD-10-CM

## 2022-11-10 DIAGNOSIS — M6281 Muscle weakness (generalized): Secondary | ICD-10-CM

## 2022-11-10 NOTE — Therapy (Signed)
OUTPATIENT PHYSICAL THERAPY TREATMENT   Patient Name: Patrick [name] MRN: [id number] DOB:1960/07/24, 62 y.o., male Today's Date: 11/10/2022    END OF SESSION:  PT End of Session - 11/10/22 1023     Visit Number 19    Number of Visits 21    Date for PT Re-Evaluation 11/24/22    Authorization Type AETNA    PT Start Time 1022    PT Stop Time 1104    PT Time Calculation (min) 42 min    Activity Tolerance Patient tolerated treatment well    Behavior During Therapy WFL for tasks assessed/performed                     Past Medical History:  Diagnosis Date   ADHD (attention deficit hyperactivity disorder), inattentive type    sees Dr. Evelene Croon    Anxiety    sees Dr. Evelene Croon    Arthritis    DDD (degenerative disc disease), lumbar    Depression    sees Dr. Evelene Croon    Environmental allergies    GERD (gastroesophageal reflux disease)    History of blood transfusion    Hypertension    Hypothyroidism    partial thyroidectomy for benign nodule    Past Surgical History:  Procedure Laterality Date   BACK SURGERY Right 1985   microdiscectomy   COLONOSCOPY  02/19/2020   per Dr. Dulce Sellar, precancerous polyp, repeat in 3 yrs   SHOULDER ARTHROSCOPY WITH ROTATOR CUFF REPAIR Left 06/15/2022   Procedure: LEFT SHOULDER ARTHROSCOPY WITH ROTATOR CUFF REPAIR;  Surgeon: Huel Cote, MD;  Location: [facility];  Service: Orthopedics;  Laterality: Left;   THYROIDECTOMY, PARTIAL  1998   TOTAL HIP ARTHROPLASTY Left 05/02/2012   Procedure: LEFT TOTAL HIP ARTHROPLASTY ANTERIOR APPROACH;  Surgeon: Shelda Pal, MD;  Location: [facility];  Service: Orthopedics;  Laterality: Left;   Patient Active Problem List   Diagnosis Date Noted   Calcific tendinitis 06/15/2022   Preoperative testing 12/26/2020   Chronic right hip pain 08/20/2020   BPH with urinary obstruction 10/17/2018   Gout 08/11/2016   Depression with anxiety 08/11/2016   HTN (hypertension) 08/11/2016   ADHD  (attention deficit hyperactivity disorder), inattentive type 08/11/2016   Hypothyroidism 08/11/2016   Environmental allergies 08/11/2016   GERD (gastroesophageal reflux disease) 08/11/2016   Overweight (BMI 25.0-29.9) 05/03/2012     REFERRING PROVIDER: Steward Drone, MD  REFERRING DIAG: S46.012A (ICD-10-CM) - Traumatic complete tear of left rotator cuff, initial encounter  S/p Lt biceps repair (cuff in tact per MD)  Rationale for Evaluation and Treatment: Rehabilitation  THERAPY DIAG:  Acute pain of left shoulder  Muscle weakness (generalized)  ONSET DATE: DOS 06/15/22  Days since surgery: 148   SUBJECTIVE:  SUBJECTIVE STATEMENT:  Patient states some tightness in UT. HEP going well.    PERTINENT HISTORY:  RC calcification, bil hip replacement, prior left knee injury  PAIN:  Are you having pain? No: NPRS scale:  0/10 Pain location: L shoulder  Pain description: sharp, pinch Aggravating factors: reaching  Relieving factors: getting arm back close to body   PRECAUTIONS:  None  WEIGHT BEARING RESTRICTIONS:  Yes POSTOPERATIVE PLAN: He will follow the biceps repair protocol.  He will be active range of motion overhead as tolerated.  FALLS:  Has patient fallen in last 6 months? No   OCCUPATION:  It consultant  PLOF:  Independent  PATIENT GOALS:  Move around and use arm normally   OBJECTIVE:  PATIENT SURVEYS:  FOTO 41   FOTO 8/8 FOTO 10/21/22: 81% function       COGNITIVE STATUS: Within functional limits for tasks assessed        RED FLAGS: None                 SENSATION: WFL   POSTURE:  EVAL: mild forward head   HAND DOMINANCE:  Right     AROM Right 8/8  Left 8/8 Right 10/21/22 Left 10/21/22  Shoulder flexion 160 155 p! 160 155  Shoulder extension 45 45     Shoulder abduction 155 145 p! At 90 155 145  Shoulder adduction        Shoulder internal rotation 70 60    Shoulder external rotation 80 70 80 75  Elbow flexion        Elbow extension        Wrist flexion        Wrist extension        Wrist ulnar deviation        Wrist radial deviation        Wrist pronation        Wrist supination        (Blank rows = not tested)        MMT   HHD in lbs  Right 8/8  Left 8/8 Right 10/21/22 Left 10/21/22  Shoulder flexion 45.8 21.5 52 31.8 *  Shoulder extension 53.0 48.2    Shoulder abduction 45.5 12.6 51.7 31, 22.3 *  Shoulder adduction        Shoulder internal rotation 35.9 31.6 46.7 36  Shoulder external rotation 26.0 15.2 31.9 25.7  Elbow flexion        Elbow extension        Wrist flexion        Wrist extension        Wrist ulnar deviation        Wrist radial deviation        Wrist pronation        Wrist supination        (Blank rows = not tested)   TREATMENT:   11/10/22 Manual: STM to L UT pre and post dry needling for trigger point identification and muscular relaxation. Trigger Point Dry-Needling  Treatment instructions: Expect mild to moderate muscle soreness. S/S of pneumothorax if dry needled over a lung field, and to seek immediate medical attention should they occur. Patient verbalized understanding of these instructions and education.  Patient Consent Given: Yes Education handout provided: Previously provided Muscles treated: L UT Electrical stimulation performed: No Parameters: N/A Treatment response/outcome: twitch response, decrease in tissue tension   Standing shoulder ER at 80-90 degrees of abduction GTB 2 x 10 Standing shoulder IR at  80-90 degrees of abduction GTB 2 x 10 Quadruped protraction/retraction 2 x 20 Quadruped thoracic rotation 2 x 10 Plank on knee walk outs with cueing for scap stabilization  2 x 5 x 10 second holds Wall walk GTB loop at wrists 4 x 10 feet  10/29/22 UBE 2 minutes forward, 2  retro level 3.5 Standing shoulder abduction with perpendicular resistance GTB 2 x 10  Standing shoulder flexion with perpendicular resistance GTB 2 x 10  Standing shoulder ER at 80 degrees of abduction GTB 2 x 10 Standing shoulder IR at 80 degrees of abduction GTB 2 x 10 Seated cable row 40# 3 x 10 Standing cable tricep 20# 3 x 10 Standing cable bicep curls 15# 3 x 10 Standing cable ER in neutral 5# 3 x 8 Standing cable IR in neutral 5# 3 x 8 Prone shoulder extension 3# 3 x 10 Prone shoulder horizontal abduction 2# 3 x 10 Prone shoulder flexion 3 x 10   10/20/22 UBE 2 minutes forward, 2 retro level 4 Reassessment Standing bilateral ER BTB 2 x 10 Standing horizontal abduction BTB 2 x 10  Standing shoulder PNF D2 pattern BTB 2 x 10  Standing shoulder abduction with perpendicular resistance GTB 2 x 10  Standing shoulder flexion with perpendicular resistance GTB 2 x 10    10/14/22 UBE 2 minutes forward, 2 retro level 4 Manual: Grade II- III inferior and AP glides in abduction at 80-100, AP glides into progressive ER Standing bilateral ER BTB 2 x 10 Standing horizontal abduction BTB 2 x 10  Supine shoulder PNF D2 pattern BTB 2 x 10  Standing shoulder flexion with perpendicular resistance GTB 2 x 10     Treatment                            9/16:  Trigger Point Dry Needling, Manual Therapy Treatment:  Initial or subsequent education regarding Trigger Point Dry Needling: Initial - education time included in Manual Did patient give consent to treatment with Trigger Point Dry Needling: Yes TPDN with skilled palpation and monitoring followed by STM to the following muscles: Rt upper trap & levator Prone spinal & rib mobs; scapular mobilization  Child pose stretch- UE wider to decrease pinching Open book Supine AP mobs at 90-100 abd Passive ER/IR stretching- ER to 90, IR to 75 today Supine D2 flexion                                                                                                                               PATIENT EDUCATION:  Education details: Anatomy of condition, POC, HEP, exercise form/rationale Person educated: Patient Education method: Explanation, Demonstration, Tactile cues, Verbal cues Education comprehension: verbalized understanding, returned demonstration, verbal cues required, tactile cues required  HOME EXERCISE PROGRAM: [id number]   ASSESSMENT:  CLINICAL IMPRESSION:  Continued with shoulder and periscapular strengthening which is performed with good mechanics with intermittent cueing  for mechanics/positioning. Began closed chain strengthening and scapular stability which is tolerated well.  Patient will continue to benefit from skilled physical therapy in order to improve function and reduce impairment.       OBJECTIVE IMPAIRMENTS: decreased activity tolerance, decreased ROM, decreased strength, increased muscle spasms, impaired flexibility, impaired UE functional use, improper body mechanics, and pain.   ACTIVITY LIMITATIONS: carrying, lifting, dressing, reach over head, and hygiene/grooming  PARTICIPATION LIMITATIONS: meal prep, cleaning, laundry, driving, shopping, community activity, and occupation  PERSONAL FACTORS: Time since onset of injury/illness/exacerbation are also affecting patient's functional outcome.   REHAB POTENTIAL: Good  CLINICAL DECISION MAKING: Stable/uncomplicated  EVALUATION COMPLEXITY: Low   GOALS: Goals reviewed with patient? Yes  SHORT TERM GOALS: Target date: 6/22  GHJ flexion and abd to 90 against gravity with proper form Baseline: Goal status: achieved  2.  Completing HEP and ADLs pain <=3/10 Baseline:  Goal status:achieved   LONG TERM GOALS: Target date: POC date  Meet FOTO goal Baseline:  Goal status: MET  2.  Able to reach all overhead objects without limitation by pain Baseline:  Goal status: MET  3.  Gross GHJ strength 5/5 Baseline:  Goal status: ongoing  4.  Able to  complete all aDLs without limitation by pain Baseline:  Goal status: MET   PLAN:  PT FREQUENCY: 1x/week  PT DURATION: through POC date  PLANNED INTERVENTIONS: Therapeutic exercises, Therapeutic activity, Neuromuscular re-education, Patient/Family education, Self Care, Joint mobilization, Aquatic Therapy, Dry Needling, Electrical stimulation, Spinal mobilization, Cryotherapy, Moist heat, scar mobilization, Taping, Traction, Ionotophoresis 4mg /ml Dexamethasone, Manual therapy, and Re-evaluation.  PLAN FOR NEXT SESSION: Shoulder, RC, periscap strength and progress as able   Wyman Songster, PT 11/10/2022, 11:12 AM

## 2022-11-14 NOTE — Therapy (Signed)
OUTPATIENT PHYSICAL THERAPY TREATMENT   Patient Name: Patrick [name] MRN: [id number] DOB:1960/09/11, 62 y.o., male Today's Date: 11/15/2022    END OF SESSION:  PT End of Session - 11/15/22 0807     Visit Number 20    Number of Visits 21    Date for PT Re-Evaluation 11/24/22    Authorization Type AETNA    PT Start Time 0805    PT Stop Time 0851    PT Time Calculation (min) 46 min    Activity Tolerance Patient tolerated treatment well    Behavior During Therapy WFL for tasks assessed/performed                      Past Medical History:  Diagnosis Date   ADHD (attention deficit hyperactivity disorder), inattentive type    sees Dr. Evelene Croon    Anxiety    sees Dr. Evelene Croon    Arthritis    DDD (degenerative disc disease), lumbar    Depression    sees Dr. Evelene Croon    Environmental allergies    GERD (gastroesophageal reflux disease)    History of blood transfusion    Hypertension    Hypothyroidism    partial thyroidectomy for benign nodule    Past Surgical History:  Procedure Laterality Date   BACK SURGERY Right 1985   microdiscectomy   COLONOSCOPY  02/19/2020   per Dr. Dulce Sellar, precancerous polyp, repeat in 3 yrs   SHOULDER ARTHROSCOPY WITH ROTATOR CUFF REPAIR Left 06/15/2022   Procedure: LEFT SHOULDER ARTHROSCOPY WITH ROTATOR CUFF REPAIR;  Surgeon: Huel Cote, MD;  Location: [facility];  Service: Orthopedics;  Laterality: Left;   THYROIDECTOMY, PARTIAL  1998   TOTAL HIP ARTHROPLASTY Left 05/02/2012   Procedure: LEFT TOTAL HIP ARTHROPLASTY ANTERIOR APPROACH;  Surgeon: Shelda Pal, MD;  Location: [facility];  Service: Orthopedics;  Laterality: Left;   Patient Active Problem List   Diagnosis Date Noted   Calcific tendinitis 06/15/2022   Preoperative testing 12/26/2020   Chronic right hip pain 08/20/2020   BPH with urinary obstruction 10/17/2018   Gout 08/11/2016   Depression with anxiety 08/11/2016   HTN (hypertension) 08/11/2016   ADHD  (attention deficit hyperactivity disorder), inattentive type 08/11/2016   Hypothyroidism 08/11/2016   Environmental allergies 08/11/2016   GERD (gastroesophageal reflux disease) 08/11/2016   Overweight (BMI 25.0-29.9) 05/03/2012     REFERRING PROVIDER: Steward Drone, MD  REFERRING DIAG: S46.012A (ICD-10-CM) - Traumatic complete tear of left rotator cuff, initial encounter  S/p Lt biceps repair (cuff in tact per MD)  Rationale for Evaluation and Treatment: Rehabilitation  THERAPY DIAG:  Acute pain of left shoulder  Muscle weakness (generalized)  ONSET DATE: DOS 06/15/22  Days since surgery: 153   SUBJECTIVE:  SUBJECTIVE STATEMENT:  Patient is 21 weeks and 6 days (5 months) post op.  Pt states he has some soreness from raking leaves though no pain this AM.  Pt has no c/o's.   PERTINENT HISTORY:  RC calcification, bil hip replacement, prior left knee injury  PAIN:  Are you having pain? No: NPRS scale:  0/10 Pain location: L shoulder  Pain description: sharp, pinch Aggravating factors: reaching  Relieving factors: getting arm back close to body   PRECAUTIONS:  None  WEIGHT BEARING RESTRICTIONS:  Yes POSTOPERATIVE PLAN: He will follow the biceps repair protocol.  He will be active range of motion overhead as tolerated.  FALLS:  Has patient fallen in last 6 months? No   OCCUPATION:  It consultant  PLOF:  Independent  PATIENT GOALS:  Move around and use arm normally   OBJECTIVE:  PATIENT SURVEYS:  FOTO 41   FOTO 8/8 FOTO 10/21/22: 81% function       COGNITIVE STATUS: Within functional limits for tasks assessed        RED FLAGS: None                 SENSATION: WFL   POSTURE:  EVAL: mild forward head   HAND DOMINANCE:  Right     AROM Right 8/8  Left 8/8  Right 10/21/22 Left 10/21/22 Left 10/28  Shoulder flexion 160 155 p! 160 155 153  Shoulder extension 45 45     Shoulder abduction 155 145 p! At 90 155 145 145  Shoulder adduction         Shoulder internal rotation 70 60     Shoulder external rotation 80 70 80 75 69  Elbow flexion         Elbow extension         Wrist flexion         Wrist extension         Wrist ulnar deviation         Wrist radial deviation         Wrist pronation         Wrist supination         (Blank rows = not tested)        MMT   HHD in lbs  Right 8/8  Left 8/8 Right 10/21/22 Left 10/21/22  Shoulder flexion 45.8 21.5 52 31.8 *  Shoulder extension 53.0 48.2    Shoulder abduction 45.5 12.6 51.7 31, 22.3 *  Shoulder adduction        Shoulder internal rotation 35.9 31.6 46.7 36  Shoulder external rotation 26.0 15.2 31.9 25.7  Elbow flexion        Elbow extension        Wrist flexion        Wrist extension        Wrist ulnar deviation        Wrist radial deviation        Wrist pronation        Wrist supination        (Blank rows = not tested)   TREATMENT:   11/15/2022 UBE 2 min fwd, 2 mins bwd at L3.5 Prone shoulder extension 3# 3x10 Prone hz abd 2# 3x10 Wall walks with GTB 2 x 5-6 steps Wall walks in an arc with RTB x 3 reps ABC's with ball on wall with 1.1# ball Standing D2 flexion with BTB 2x10 Standing shoulder ER at 80 degrees of abduction GTB 2 x 10 Standing shoulder  IR at 80 degrees of abduction GTB  x 10  Pt received L shoulder flexion, abd, and ER PROM per pt and tissue tolerance   11/10/22 Manual: STM to L UT pre and post dry needling for trigger point identification and muscular relaxation. Trigger Point Dry-Needling  Treatment instructions: Expect mild to moderate muscle soreness. S/S of pneumothorax if dry needled over a lung field, and to seek immediate medical attention should they occur. Patient verbalized understanding of these instructions and education.  Patient Consent  Given: Yes Education handout provided: Previously provided Muscles treated: L UT Electrical stimulation performed: No Parameters: N/A Treatment response/outcome: twitch response, decrease in tissue tension   Standing shoulder ER at 80-90 degrees of abduction GTB 2 x 10 Standing shoulder IR at 80-90 degrees of abduction GTB 2 x 10 Quadruped protraction/retraction 2 x 20 Quadruped thoracic rotation 2 x 10 Plank on knee walk outs with cueing for scap stabilization  2 x 5 x 10 second holds Wall walk GTB loop at wrists 4 x 10 feet  10/29/22 UBE 2 minutes forward, 2 retro level 3.5 Standing shoulder abduction with perpendicular resistance GTB 2 x 10  Standing shoulder flexion with perpendicular resistance GTB 2 x 10  Standing shoulder ER at 80 degrees of abduction GTB 2 x 10 Standing shoulder IR at 80 degrees of abduction GTB 2 x 10 Seated cable row 40# 3 x 10 Standing cable tricep 20# 3 x 10 Standing cable bicep curls 15# 3 x 10 Standing cable ER in neutral 5# 3 x 8 Standing cable IR in neutral 5# 3 x 8 Prone shoulder extension 3# 3 x 10 Prone shoulder horizontal abduction 2# 3 x 10 Prone shoulder flexion 3 x 10   10/20/22 UBE 2 minutes forward, 2 retro level 4 Reassessment Standing bilateral ER BTB 2 x 10 Standing horizontal abduction BTB 2 x 10  Standing shoulder PNF D2 pattern BTB 2 x 10  Standing shoulder abduction with perpendicular resistance GTB 2 x 10  Standing shoulder flexion with perpendicular resistance GTB 2 x 10    10/14/22 UBE 2 minutes forward, 2 retro level 4 Manual: Grade II- III inferior and AP glides in abduction at 80-100, AP glides into progressive ER Standing bilateral ER BTB 2 x 10 Standing horizontal abduction BTB 2 x 10  Supine shoulder PNF D2 pattern BTB 2 x 10  Standing shoulder flexion with perpendicular resistance GTB 2 x 10                                                                                                                             PATIENT EDUCATION:  Education details: Teacher, music of condition, POC, HEP, exercise form/rationale Person educated: Patient Education method: Explanation, Demonstration, Tactile cues, Verbal cues Education comprehension: verbalized understanding, returned demonstration, verbal cues required, tactile cues required  HOME EXERCISE PROGRAM: [id number]   ASSESSMENT:  CLINICAL IMPRESSION: Pt is progressing well with shoulder and scapular strength as evidenced by performance  and progression of exercises.  Pt performed ther ex and neuro re-ed activities well with intermittent cuing for correct form and positioning.  Pt had difficulty with correct form with resisted IR in elevation and required assistance for correct form and positioning.  Pt was fatigued with ABC's with ball on wall.  Pt tolerated PROM well.  PT assessed AROM today and pt had no pain with abduction AROM.  He responded well to Rx reporting no pain, just soreness after Rx.  OBJECTIVE IMPAIRMENTS: decreased activity tolerance, decreased ROM, decreased strength, increased muscle spasms, impaired flexibility, impaired UE functional use, improper body mechanics, and pain.   ACTIVITY LIMITATIONS: carrying, lifting, dressing, reach over head, and hygiene/grooming  PARTICIPATION LIMITATIONS: meal prep, cleaning, laundry, driving, shopping, community activity, and occupation  PERSONAL FACTORS: Time since onset of injury/illness/exacerbation are also affecting patient's functional outcome.   REHAB POTENTIAL: Good  CLINICAL DECISION MAKING: Stable/uncomplicated  EVALUATION COMPLEXITY: Low   GOALS: Goals reviewed with patient? Yes  SHORT TERM GOALS: Target date: 6/22  GHJ flexion and abd to 90 against gravity with proper form Baseline: Goal status: achieved  2.  Completing HEP and ADLs pain <=3/10 Baseline:  Goal status:achieved   LONG TERM GOALS: Target date: POC date  Meet FOTO goal Baseline:  Goal status: MET  2.  Able  to reach all overhead objects without limitation by pain Baseline:  Goal status: MET  3.  Gross GHJ strength 5/5 Baseline:  Goal status: ongoing  4.  Able to complete all aDLs without limitation by pain Baseline:  Goal status: MET   PLAN:  PT FREQUENCY: 1x/week  PT DURATION: through POC date  PLANNED INTERVENTIONS: Therapeutic exercises, Therapeutic activity, Neuromuscular re-education, Patient/Family education, Self Care, Joint mobilization, Aquatic Therapy, Dry Needling, Electrical stimulation, Spinal mobilization, Cryotherapy, Moist heat, scar mobilization, Taping, Traction, Ionotophoresis 4mg /ml Dexamethasone, Manual therapy, and Re-evaluation.  PLAN FOR NEXT SESSION: Shoulder, RC, periscap strength and progress as able per protocol  Audie Clear III PT, DPT 11/15/22 2:42 PM

## 2022-11-15 ENCOUNTER — Ambulatory Visit (HOSPITAL_BASED_OUTPATIENT_CLINIC_OR_DEPARTMENT_OTHER): Payer: 59 | Admitting: Physical Therapy

## 2022-11-15 ENCOUNTER — Encounter (HOSPITAL_BASED_OUTPATIENT_CLINIC_OR_DEPARTMENT_OTHER): Payer: Self-pay | Admitting: Physical Therapy

## 2022-11-15 DIAGNOSIS — M25512 Pain in left shoulder: Secondary | ICD-10-CM | POA: Diagnosis not present

## 2022-11-15 DIAGNOSIS — M6281 Muscle weakness (generalized): Secondary | ICD-10-CM

## 2022-11-22 ENCOUNTER — Encounter (HOSPITAL_BASED_OUTPATIENT_CLINIC_OR_DEPARTMENT_OTHER): Payer: Self-pay | Admitting: Physical Therapy

## 2022-11-22 ENCOUNTER — Ambulatory Visit (HOSPITAL_BASED_OUTPATIENT_CLINIC_OR_DEPARTMENT_OTHER): Payer: 59 | Attending: Orthopaedic Surgery | Admitting: Physical Therapy

## 2022-11-22 DIAGNOSIS — M25512 Pain in left shoulder: Secondary | ICD-10-CM | POA: Insufficient documentation

## 2022-11-22 DIAGNOSIS — M6281 Muscle weakness (generalized): Secondary | ICD-10-CM | POA: Insufficient documentation

## 2022-11-22 NOTE — Therapy (Signed)
OUTPATIENT PHYSICAL THERAPY TREATMENT  PHYSICAL THERAPY DISCHARGE SUMMARY  Visits from Start of Care: 21  Plan: Patient agrees to discharge.  Patient goals were not met. Patient is being discharged due to meeting the stated rehab goals.         Patient Name: Patrick [name] MRN: [id number] DOB:1960-06-28, 62 y.o., male Today's Date: 11/22/2022    END OF SESSION:  PT End of Session - 11/22/22 1039     Visit Number 21    Number of Visits 21    Date for PT Re-Evaluation 11/24/22    Authorization Type AETNA    PT Start Time 1017    PT Stop Time 1048    PT Time Calculation (min) 31 min    Activity Tolerance Patient tolerated treatment well    Behavior During Therapy WFL for tasks assessed/performed                       Past Medical History:  Diagnosis Date   ADHD (attention deficit hyperactivity disorder), inattentive type    sees Dr. Evelene Croon    Anxiety    sees Dr. Evelene Croon    Arthritis    DDD (degenerative disc disease), lumbar    Depression    sees Dr. Evelene Croon    Environmental allergies    GERD (gastroesophageal reflux disease)    History of blood transfusion    Hypertension    Hypothyroidism    partial thyroidectomy for benign nodule    Past Surgical History:  Procedure Laterality Date   BACK SURGERY Right 1985   microdiscectomy   COLONOSCOPY  02/19/2020   per Dr. Dulce Sellar, precancerous polyp, repeat in 3 yrs   SHOULDER ARTHROSCOPY WITH ROTATOR CUFF REPAIR Left 06/15/2022   Procedure: LEFT SHOULDER ARTHROSCOPY WITH ROTATOR CUFF REPAIR;  Surgeon: Huel Cote, MD;  Location: [facility];  Service: Orthopedics;  Laterality: Left;   THYROIDECTOMY, PARTIAL  1998   TOTAL HIP ARTHROPLASTY Left 05/02/2012   Procedure: LEFT TOTAL HIP ARTHROPLASTY ANTERIOR APPROACH;  Surgeon: Shelda Pal, MD;  Location: [facility];  Service: Orthopedics;  Laterality: Left;   Patient Active Problem List   Diagnosis Date Noted   Calcific tendinitis 06/15/2022    Preoperative testing 12/26/2020   Chronic right hip pain 08/20/2020   BPH with urinary obstruction 10/17/2018   Gout 08/11/2016   Depression with anxiety 08/11/2016   HTN (hypertension) 08/11/2016   ADHD (attention deficit hyperactivity disorder), inattentive type 08/11/2016   Hypothyroidism 08/11/2016   Environmental allergies 08/11/2016   GERD (gastroesophageal reflux disease) 08/11/2016   Overweight (BMI 25.0-29.9) 05/03/2012     REFERRING PROVIDER: Steward Drone, MD  REFERRING DIAG: S46.012A (ICD-10-CM) - Traumatic complete tear of left rotator cuff, initial encounter  S/p Lt biceps repair (cuff in tact per MD)  Rationale for Evaluation and Treatment: Rehabilitation  THERAPY DIAG:  Acute pain of left shoulder  Muscle weakness (generalized)  ONSET DATE: DOS 06/15/22  Days since surgery: 160   SUBJECTIVE:  SUBJECTIVE STATEMENT:  Pt states that the shoulder fees better but will seldomly have a pinch here and there. Normally, he has no issues with the shoulder. Pt states 95% of the day is without issue. He is at least 80% back to normal.    PERTINENT HISTORY:  RC calcification, bil hip replacement, prior left knee injury  PAIN:  Are you having pain? No: NPRS scale:  0/10 Pain location: L shoulder  Pain description: sharp, pinch Aggravating factors: reaching  Relieving factors: getting arm back close to body   PRECAUTIONS:  None  WEIGHT BEARING RESTRICTIONS:  Yes POSTOPERATIVE PLAN: He will follow the biceps repair protocol.  He will be active range of motion overhead as tolerated.  FALLS:  Has patient fallen in last 6 months? No   OCCUPATION:  It consultant  PLOF:  Independent  PATIENT GOALS:  Move around and use arm normally   OBJECTIVE:  PATIENT SURVEYS:  FOTO 41    FOTO 8/8 FOTO 10/21/22: 81% function       COGNITIVE STATUS: Within functional limits for tasks assessed        RED FLAGS: None                 SENSATION: WFL   POSTURE:  EVAL: mild forward head   HAND DOMINANCE:  Right     AROM Right 8/8  Left 8/8 Right 10/21/22 Left 10/21/22 Left 10/28 11/4  Shoulder flexion 160 155 p! 160 155 153 161  Shoulder extension 45 45      Shoulder abduction 155 145 p! At 90 155 145 145 149  Shoulder adduction          Shoulder internal rotation 70 60      Shoulder external rotation 80 70 80 75 69 82  Elbow flexion          Elbow extension          Wrist flexion          Wrist extension          Wrist ulnar deviation          Wrist radial deviation          Wrist pronation          Wrist supination          (Blank rows = not tested)        MMT   HHD in lbs  Right 8/8  Left 8/8 Right 10/21/22 Left 10/21/22 10/3 L  Shoulder flexion 45.8 21.5 52 31.8 * 35.0  Shoulder extension 53.0 48.2     Shoulder abduction 45.5 12.6 51.7 31, 22.3 * 32.1  Shoulder adduction         Shoulder internal rotation 35.9 31.6 46.7 36 39.4  Shoulder external rotation 26.0 15.2 31.9 25.7 28.3  Elbow flexion         Elbow extension         Wrist flexion         Wrist extension         Wrist ulnar deviation         Wrist radial deviation         Wrist pronation         Wrist supination         (Blank rows = not tested)   TREATMENT:    11/4   Program Notes dowel rotation stretch: dowel goes behind arm 5s 10x pec stretch 30s 3x (at doorway) Wall slide at  doorway 2x10 3s towel stretch behind the back 3s 10x  Exercises - Standing Shoulder Row with Anchored Resistance  - 1 x daily - 3-4 x weekly - 3 sets - 10 reps - Shoulder Abduction with Dumbbells - Thumbs Up  - 1 x daily - 3-4 x weekly - 2 sets - 10 reps - Push Up on Table  - 1 x daily - 3-4 x weekly - 3 sets - 8 reps - Shoulder External Rotation and Scapular Retraction with Resistance  - 1 x  daily - 3-4 x weekly - 2 sets - 10 reps - Standing Shoulder Single Arm PNF D2 Flexion with Resistance  - 1 x daily - 3-4 x weekly - 2-3 sets - 6-10 reps - Standing Single Arm Shoulder External Rotation in Abduction with Anchored Resistance  - 1 x daily - 3-4 x weekly - 2 sets - 10 reps  11/15/2022 UBE 2 min fwd, 2 mins bwd at L3.5 Prone shoulder extension 3# 3x10 Prone hz abd 2# 3x10 Wall walks with GTB 2 x 5-6 steps Wall walks in an arc with RTB x 3 reps ABC's with ball on wall with 1.1# ball Standing D2 flexion with BTB 2x10 Standing shoulder ER at 80 degrees of abduction GTB 2 x 10 Standing shoulder IR at 80 degrees of abduction GTB  x 10  Pt received L shoulder flexion, abd, and ER PROM per pt and tissue tolerance   11/10/22 Manual: STM to L UT pre and post dry needling for trigger point identification and muscular relaxation. Trigger Point Dry-Needling  Treatment instructions: Expect mild to moderate muscle soreness. S/S of pneumothorax if dry needled over a lung field, and to seek immediate medical attention should they occur. Patient verbalized understanding of these instructions and education.  Patient Consent Given: Yes Education handout provided: Previously provided Muscles treated: L UT Electrical stimulation performed: No Parameters: N/A Treatment response/outcome: twitch response, decrease in tissue tension   Standing shoulder ER at 80-90 degrees of abduction GTB 2 x 10 Standing shoulder IR at 80-90 degrees of abduction GTB 2 x 10 Quadruped protraction/retraction 2 x 20 Quadruped thoracic rotation 2 x 10 Plank on knee walk outs with cueing for scap stabilization  2 x 5 x 10 second holds Wall walk GTB loop at wrists 4 x 10 feet  10/29/22 UBE 2 minutes forward, 2 retro level 3.5 Standing shoulder abduction with perpendicular resistance GTB 2 x 10  Standing shoulder flexion with perpendicular resistance GTB 2 x 10  Standing shoulder ER at 80 degrees of abduction GTB  2 x 10 Standing shoulder IR at 80 degrees of abduction GTB 2 x 10 Seated cable row 40# 3 x 10 Standing cable tricep 20# 3 x 10 Standing cable bicep curls 15# 3 x 10 Standing cable ER in neutral 5# 3 x 8 Standing cable IR in neutral 5# 3 x 8 Prone shoulder extension 3# 3 x 10 Prone shoulder horizontal abduction 2# 3 x 10 Prone shoulder flexion 3 x 10   10/20/22 UBE 2 minutes forward, 2 retro level 4 Reassessment Standing bilateral ER BTB 2 x 10 Standing horizontal abduction BTB 2 x 10  Standing shoulder PNF D2 pattern BTB 2 x 10  Standing shoulder abduction with perpendicular resistance GTB 2 x 10  Standing shoulder flexion with perpendicular resistance GTB 2 x 10    10/14/22 UBE 2 minutes forward, 2 retro level 4 Manual: Grade II- III inferior and AP glides in abduction at 80-100, AP glides  into progressive ER Standing bilateral ER BTB 2 x 10 Standing horizontal abduction BTB 2 x 10  Supine shoulder PNF D2 pattern BTB 2 x 10  Standing shoulder flexion with perpendicular resistance GTB 2 x 10                                                                                                                            PATIENT EDUCATION:  Education details: Anatomy of condition, POC, HEP, exercise form/rationale Person educated: Patient Education method: Explanation, Demonstration, Tactile cues, Verbal cues Education comprehension: verbalized understanding, returned demonstration, verbal cues required, tactile cues required  HOME EXERCISE PROGRAM: [id number]   ASSESSMENT:  CLINICAL IMPRESSION: Pt has completed 21 sessions of PT at this time. Pt demos improved R shoulder AROM as well as strength of the R UE. However, pt still with strength deficits. At this time, pt feels he is able to continue with independent HEP to work on remaining deficits. HEP reviewed with cues given. Edu about progression, regression, and tapering of HEP provided. D/C this episode of care at this time.    OBJECTIVE IMPAIRMENTS: decreased activity tolerance, decreased ROM, decreased strength, increased muscle spasms, impaired flexibility, impaired UE functional use, improper body mechanics, and pain.   ACTIVITY LIMITATIONS: carrying, lifting, dressing, reach over head, and hygiene/grooming  PARTICIPATION LIMITATIONS: meal prep, cleaning, laundry, driving, shopping, community activity, and occupation  PERSONAL FACTORS: Time since onset of injury/illness/exacerbation are also affecting patient's functional outcome.   REHAB POTENTIAL: Good  CLINICAL DECISION MAKING: Stable/uncomplicated  EVALUATION COMPLEXITY: Low   GOALS: Goals reviewed with patient? Yes  SHORT TERM GOALS: Target date: 6/22  GHJ flexion and abd to 90 against gravity with proper form Baseline: Goal status: achieved  2.  Completing HEP and ADLs pain <=3/10 Baseline:  Goal status:achieved   LONG TERM GOALS: Target date: POC date  Meet FOTO goal Baseline:  Goal status: MET  2.  Able to reach all overhead objects without limitation by pain Baseline:  Goal status: MET  3.  Gross GHJ strength 5/5 Baseline:  Goal status: MET  4.  Able to complete all aDLs without limitation by pain Baseline:  Goal status: MET   PLAN:  PT FREQUENCY: 1x/week  PT DURATION: through POC date  PLANNED INTERVENTIONS: Therapeutic exercises, Therapeutic activity, Neuromuscular re-education, Patient/Family education, Self Care, Joint mobilization, Aquatic Therapy, Dry Needling, Electrical stimulation, Spinal mobilization, Cryotherapy, Moist heat, scar mobilization, Taping, Traction, Ionotophoresis 4mg /ml Dexamethasone, Manual therapy, and Re-evaluation.    Zebedee Iba PT, DPT 11/22/22 10:53 AM

## 2022-11-29 ENCOUNTER — Ambulatory Visit (HOSPITAL_BASED_OUTPATIENT_CLINIC_OR_DEPARTMENT_OTHER): Payer: 59 | Admitting: Physical Therapy

## 2022-12-02 ENCOUNTER — Other Ambulatory Visit: Payer: Self-pay | Admitting: Family Medicine

## 2022-12-09 ENCOUNTER — Other Ambulatory Visit: Payer: Self-pay | Admitting: Family Medicine

## 2022-12-13 ENCOUNTER — Ambulatory Visit (HOSPITAL_BASED_OUTPATIENT_CLINIC_OR_DEPARTMENT_OTHER): Payer: 59 | Admitting: Orthopaedic Surgery

## 2022-12-22 ENCOUNTER — Ambulatory Visit (HOSPITAL_BASED_OUTPATIENT_CLINIC_OR_DEPARTMENT_OTHER): Payer: 59 | Admitting: Orthopaedic Surgery

## 2022-12-24 ENCOUNTER — Encounter (HOSPITAL_BASED_OUTPATIENT_CLINIC_OR_DEPARTMENT_OTHER): Payer: Self-pay

## 2022-12-24 ENCOUNTER — Ambulatory Visit (HOSPITAL_BASED_OUTPATIENT_CLINIC_OR_DEPARTMENT_OTHER): Payer: 59 | Admitting: Student

## 2022-12-26 ENCOUNTER — Other Ambulatory Visit: Payer: Self-pay | Admitting: Family Medicine

## 2023-03-09 ENCOUNTER — Other Ambulatory Visit: Payer: Self-pay | Admitting: Family Medicine

## 2023-03-30 ENCOUNTER — Other Ambulatory Visit: Payer: Self-pay | Admitting: Family Medicine

## 2023-06-05 ENCOUNTER — Other Ambulatory Visit: Payer: Self-pay | Admitting: Family Medicine

## 2023-06-06 ENCOUNTER — Other Ambulatory Visit: Payer: Self-pay | Admitting: Family Medicine

## 2023-06-09 ENCOUNTER — Other Ambulatory Visit: Payer: Self-pay | Admitting: Family Medicine

## 2023-06-14 ENCOUNTER — Other Ambulatory Visit: Payer: Self-pay | Admitting: Family Medicine

## 2023-06-14 MED ORDER — LOSARTAN POTASSIUM 100 MG PO TABS: 100.0000 mg | ORAL_TABLET | Freq: Every day | ORAL | 0 refills | Status: DC

## 2023-06-14 MED ORDER — PROPRANOLOL HCL ER 80 MG PO CP24: 80.0000 mg | ORAL_CAPSULE | Freq: Every day | ORAL | 0 refills | Status: DC

## 2023-06-14 MED ORDER — LEVOTHYROXINE SODIUM 50 MCG PO TABS: 50.0000 ug | ORAL_TABLET | Freq: Every day | ORAL | 0 refills | Status: DC

## 2023-06-14 NOTE — Addendum Note (Signed)
 Addended by: Erminia Hazel on: 06/14/2023 02:38 PM   Modules accepted: Orders

## 2023-06-14 NOTE — Telephone Encounter (Signed)
 Copied from CRM [id number]. Topic: Clinical - Medication Refill >> Jun 14, 2023  8:09 AM [place] E wrote: Medication: levothyroxine  (SYNTHROID ) 50 MCG tablet propranolol  ER (INDERAL  LA) 80 MG 24 hr capsule losartan  (COZAAR ) 100 MG tablet   Has the patient contacted their pharmacy? Yes (Agent: If no, request that the patient contact the pharmacy for the refill. If patient does not wish to contact the pharmacy document the reason why and proceed with request.) (Agent: If yes, when and what did the pharmacy advise?)  This is the patient's preferred pharmacy:  [id number] [place], [place] - [address] AT [facility] [address] [facility] [address] [facility] [address] [place] [address] Phone: [phone] Fax: [phone]   Is this the correct pharmacy for this prescription? Yes If no, delete pharmacy and type the correct one.   Has the prescription been filled recently? No  Is the patient out of the medication? Yes  Has the patient been seen for an appointment in the last year OR does the patient have an upcoming appointment? Yes  Can we respond through MyChart? Yes  Agent: Please be advised that Rx refills may take up to 3 business days. We ask that you follow-up with your pharmacy.

## 2023-07-01 ENCOUNTER — Encounter: Payer: Self-pay | Admitting: Family Medicine

## 2023-07-01 ENCOUNTER — Ambulatory Visit (INDEPENDENT_AMBULATORY_CARE_PROVIDER_SITE_OTHER): Admitting: Family Medicine

## 2023-07-01 VITALS — BP 124/80 | HR 56 | Temp 98.7°F | Ht 70.0 in | Wt 211.0 lb

## 2023-07-01 DIAGNOSIS — Z125 Encounter for screening for malignant neoplasm of prostate: Secondary | ICD-10-CM | POA: Diagnosis not present

## 2023-07-01 DIAGNOSIS — Z0000 Encounter for general adult medical examination without abnormal findings: Secondary | ICD-10-CM

## 2023-07-01 DIAGNOSIS — E890 Postprocedural hypothyroidism: Secondary | ICD-10-CM

## 2023-07-01 DIAGNOSIS — M1000 Idiopathic gout, unspecified site: Secondary | ICD-10-CM

## 2023-07-01 LAB — T3, FREE: T3, Free: 2.9 pg/mL (ref 2.3–4.2)

## 2023-07-01 LAB — CBC WITH DIFFERENTIAL/PLATELET
Basophils Absolute: 0 10*3/uL (ref 0.0–0.1)
Basophils Relative: 0.8 % (ref 0.0–3.0)
Eosinophils Absolute: 0.3 10*3/uL (ref 0.0–0.7)
Eosinophils Relative: 4.7 % (ref 0.0–5.0)
HCT: 46.4 % (ref 39.0–52.0)
Hemoglobin: 15.5 g/dL (ref 13.0–17.0)
Lymphocytes Relative: 24.8 % (ref 12.0–46.0)
Lymphs Abs: 1.4 10*3/uL (ref 0.7–4.0)
MCHC: 33.4 g/dL (ref 30.0–36.0)
MCV: 92 fl (ref 78.0–100.0)
Monocytes Absolute: 0.6 10*3/uL (ref 0.1–1.0)
Monocytes Relative: 11.3 % (ref 3.0–12.0)
Neutro Abs: 3.3 10*3/uL (ref 1.4–7.7)
Neutrophils Relative %: 58.4 % (ref 43.0–77.0)
Platelets: 184 10*3/uL (ref 150.0–400.0)
RBC: 5.05 Mil/uL (ref 4.22–5.81)
RDW: 14.3 % (ref 11.5–15.5)
WBC: 5.6 10*3/uL (ref 4.0–10.5)

## 2023-07-01 LAB — HEMOGLOBIN A1C: Hgb A1c MFr Bld: 5.5 % (ref 4.6–6.5)

## 2023-07-01 LAB — BASIC METABOLIC PANEL WITH GFR
BUN: 13 mg/dL (ref 6–23)
CO2: 31 meq/L (ref 19–32)
Calcium: 8.9 mg/dL (ref 8.4–10.5)
Chloride: 105 meq/L (ref 96–112)
Creatinine, Ser: 1.14 mg/dL (ref 0.40–1.50)
GFR: 69.56 mL/min (ref >60.00)
Glucose, Bld: 88 mg/dL (ref 70–99)
Potassium: 4 meq/L (ref 3.5–5.1)
Sodium: 142 meq/L (ref 135–145)

## 2023-07-01 LAB — LIPID PANEL
Cholesterol: 174 mg/dL (ref 0–200)
HDL: 52.6 mg/dL (ref >39.00)
LDL Cholesterol: 111 mg/dL — ABNORMAL HIGH (ref 0–99)
NonHDL: 121.25
Total CHOL/HDL Ratio: 3
Triglycerides: 51 mg/dL (ref 0.0–149.0)
VLDL: 10.2 mg/dL (ref 0.0–40.0)

## 2023-07-01 LAB — TSH: TSH: 1.5 u[IU]/mL (ref 0.35–5.50)

## 2023-07-01 LAB — HEPATIC FUNCTION PANEL
ALT: 10 U/L (ref 0–53)
AST: 19 U/L (ref 0–37)
Albumin: 4.4 g/dL (ref 3.5–5.2)
Alkaline Phosphatase: 77 U/L (ref 39–117)
Bilirubin, Direct: 0.2 mg/dL (ref 0.0–0.3)
Total Bilirubin: 0.9 mg/dL (ref 0.2–1.2)
Total Protein: 6.5 g/dL (ref 6.0–8.3)

## 2023-07-01 LAB — URIC ACID: Uric Acid, Serum: 5.2 mg/dL (ref 4.0–7.8)

## 2023-07-01 LAB — T4, FREE: Free T4: 0.84 ng/dL (ref 0.60–1.60)

## 2023-07-01 LAB — PSA: PSA: 2.31 ng/mL (ref 0.10–4.00)

## 2023-07-01 MED ORDER — AMLODIPINE BESYLATE 5 MG PO TABS: 5.0000 mg | ORAL_TABLET | Freq: Every day | ORAL | 3 refills | Status: DC

## 2023-07-01 MED ORDER — TAMSULOSIN HCL 0.4 MG PO CAPS: 0.8000 mg | ORAL_CAPSULE | Freq: Every day | ORAL | 3 refills | Status: AC

## 2023-07-01 MED ORDER — LEVOTHYROXINE SODIUM 50 MCG PO TABS: 50.0000 ug | ORAL_TABLET | Freq: Every day | ORAL | 3 refills | Status: AC

## 2023-07-01 MED ORDER — LOSARTAN POTASSIUM 100 MG PO TABS: 100.0000 mg | ORAL_TABLET | Freq: Every day | ORAL | 3 refills | Status: AC

## 2023-07-01 MED ORDER — ALLOPURINOL 100 MG PO TABS: 100.0000 mg | ORAL_TABLET | Freq: Every day | ORAL | 3 refills | Status: AC

## 2023-07-01 MED ORDER — OMEPRAZOLE 20 MG PO CPDR: 20.0000 mg | DELAYED_RELEASE_CAPSULE | Freq: Every day | ORAL | 3 refills | Status: AC

## 2023-07-01 MED ORDER — PROPRANOLOL HCL ER 80 MG PO CP24: 80.0000 mg | ORAL_CAPSULE | Freq: Every day | ORAL | 3 refills | Status: AC

## 2023-07-01 NOTE — Progress Notes (Signed)
 Subjective:    Patient ID: Patrick [name], male    DOB: 1960-12-11, 63 y.o.   MRN: [id number]  HPI Here for a well exam. He feels well in general. He tired taking 0.4 mg of Flomaxm but this has not helped much. Some nights he has to get up 5-6 time to urinate.    Review of Systems  Constitutional: Negative.   HENT: Negative.    Eyes: Negative.   Respiratory: Negative.    Cardiovascular: Negative.   Gastrointestinal: Negative.   Genitourinary:  Positive for frequency.  Musculoskeletal: Negative.   Skin: Negative.   Neurological: Negative.   Psychiatric/Behavioral: Negative.         Objective:   Physical Exam Constitutional:      General: He is not in acute distress.    Appearance: Normal appearance. He is well-developed. He is not diaphoretic.  HENT:     Head: Normocephalic and atraumatic.     Right Ear: External ear normal.     Left Ear: External ear normal.     Nose: Nose normal.     Mouth/Throat:     Pharynx: No oropharyngeal exudate.   Eyes:     General: No scleral icterus.       Right eye: No discharge.        Left eye: No discharge.     Conjunctiva/sclera: Conjunctivae normal.     Pupils: Pupils are equal, round, and reactive to light.   Neck:     Thyroid : No thyromegaly.     Vascular: No JVD.     Trachea: No tracheal deviation.   Cardiovascular:     Rate and Rhythm: Normal rate and regular rhythm.     Pulses: Normal pulses.     Heart sounds: Normal heart sounds. No murmur heard.    No friction rub. No gallop.  Pulmonary:     Effort: Pulmonary effort is normal. No respiratory distress.     Breath sounds: Normal breath sounds. No wheezing or rales.  Chest:     Chest wall: No tenderness.  Abdominal:     General: Bowel sounds are normal. There is no distension.     Palpations: Abdomen is soft. There is no mass.     Tenderness: There is no abdominal tenderness. There is no guarding or rebound.  Genitourinary:    Penis: Normal. No tenderness.       Testes: Normal.     Rectum: Normal. Guaiac result negative.     Comments: Prostate is mildly enlarged but smooth   Musculoskeletal:        General: No tenderness. Normal range of motion.     Cervical back: Neck supple.  Lymphadenopathy:     Cervical: No cervical adenopathy.   Skin:    General: Skin is warm and dry.     Coloration: Skin is not pale.     Findings: No erythema or rash.   Neurological:     General: No focal deficit present.     Mental Status: He is alert and oriented to person, place, and time.     Cranial Nerves: No cranial nerve deficit.     Motor: No abnormal muscle tone.     Coordination: Coordination normal.     Deep Tendon Reflexes: Reflexes are normal and symmetric. Reflexes normal.   Psychiatric:        Mood and Affect: Mood normal.        Behavior: Behavior normal.        Thought  Content: Thought content normal.        Judgment: Judgment normal.           Assessment & Plan:  Well exam. We discussed diet and exercise. Get fasting labs. For the BPH, we will increase the Flomax  to 0.8 mg nightly.  Corita Diego, MD

## 2023-07-04 ENCOUNTER — Ambulatory Visit: Payer: Self-pay | Admitting: Family Medicine

## 2023-08-13 ENCOUNTER — Other Ambulatory Visit: Payer: Self-pay | Admitting: Family Medicine

## 2023-11-21 ENCOUNTER — Encounter: Payer: Self-pay | Admitting: Radiology
